# Patient Record
Sex: Female | Born: 1950 | Race: Black or African American | Hispanic: No | Marital: Married
Health system: Midwestern US, Community
[De-identification: ages and names within clinical notes are randomized; demographics above are authoritative.]

## PROBLEM LIST (undated history)

## (undated) DIAGNOSIS — F329 Major depressive disorder, single episode, unspecified: Secondary | ICD-10-CM

## (undated) DIAGNOSIS — R5383 Other fatigue: Secondary | ICD-10-CM

## (undated) DIAGNOSIS — K279 Peptic ulcer, site unspecified, unspecified as acute or chronic, without hemorrhage or perforation: Secondary | ICD-10-CM

## (undated) DIAGNOSIS — F32A Depression, unspecified: Secondary | ICD-10-CM

## (undated) DIAGNOSIS — F411 Generalized anxiety disorder: Secondary | ICD-10-CM

## (undated) DIAGNOSIS — E079 Disorder of thyroid, unspecified: Secondary | ICD-10-CM

## (undated) DIAGNOSIS — M1990 Unspecified osteoarthritis, unspecified site: Secondary | ICD-10-CM

## (undated) DIAGNOSIS — R634 Abnormal weight loss: Secondary | ICD-10-CM

## (undated) DIAGNOSIS — J189 Pneumonia, unspecified organism: Secondary | ICD-10-CM

## (undated) DIAGNOSIS — B3781 Candidal esophagitis: Secondary | ICD-10-CM

## (undated) DIAGNOSIS — Z1231 Encounter for screening mammogram for malignant neoplasm of breast: Secondary | ICD-10-CM

## (undated) DIAGNOSIS — Z1239 Encounter for other screening for malignant neoplasm of breast: Secondary | ICD-10-CM

## (undated) DIAGNOSIS — N6320 Unspecified lump in the left breast, unspecified quadrant: Secondary | ICD-10-CM

## (undated) DIAGNOSIS — Z1329 Encounter for screening for other suspected endocrine disorder: Secondary | ICD-10-CM

## (undated) DIAGNOSIS — N6324 Unspecified lump in the left breast, lower inner quadrant: Secondary | ICD-10-CM

## (undated) DIAGNOSIS — E7800 Pure hypercholesterolemia, unspecified: Principal | ICD-10-CM

## (undated) DIAGNOSIS — I10 Essential (primary) hypertension: Secondary | ICD-10-CM

## (undated) DIAGNOSIS — M8588 Other specified disorders of bone density and structure, other site: Secondary | ICD-10-CM

## (undated) DIAGNOSIS — M899 Disorder of bone, unspecified: Principal | ICD-10-CM

## (undated) DIAGNOSIS — D509 Iron deficiency anemia, unspecified: Secondary | ICD-10-CM

## (undated) DIAGNOSIS — R7303 Prediabetes: Secondary | ICD-10-CM

## (undated) DIAGNOSIS — Z0000 Encounter for general adult medical examination without abnormal findings: Secondary | ICD-10-CM

## (undated) HISTORY — DX: Generalized anxiety disorder: F41.1

## (undated) HISTORY — DX: Candidal esophagitis: B37.81

## (undated) HISTORY — DX: Abnormal weight loss: R63.4

## (undated) HISTORY — DX: Major depressive disorder, single episode, unspecified: F32.9

## (undated) HISTORY — DX: Pneumonia, unspecified organism: J18.9

## (undated) HISTORY — DX: Disorder of thyroid, unspecified: E07.9

## (undated) HISTORY — DX: Unspecified osteoarthritis, unspecified site: M19.90

## (undated) HISTORY — DX: Peptic ulcer, site unspecified, unspecified as acute or chronic, without hemorrhage or perforation: K27.9

## (undated) HISTORY — DX: Depression, unspecified: F32.A

## (undated) HISTORY — DX: Other fatigue: R53.83

---
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
## 1985-02-06 HISTORY — PX: TUBAL LIGATION: SHX77

## 2000-07-20 ENCOUNTER — Other Ambulatory Visit: Admission: RE | Admit: 2000-07-20 | Discharge: 2000-07-20 | Payer: Self-pay | Admitting: Internal Medicine

## 2000-12-19 ENCOUNTER — Ambulatory Visit (HOSPITAL_COMMUNITY): Admission: RE | Admit: 2000-12-19 | Discharge: 2000-12-19 | Payer: Self-pay | Admitting: General Surgery

## 2001-04-29 ENCOUNTER — Emergency Department (HOSPITAL_COMMUNITY): Admission: EM | Admit: 2001-04-29 | Discharge: 2001-04-29 | Payer: Self-pay | Admitting: Emergency Medicine

## 2001-04-29 ENCOUNTER — Encounter: Payer: Self-pay | Admitting: Emergency Medicine

## 2001-11-12 ENCOUNTER — Encounter: Payer: Self-pay | Admitting: Emergency Medicine

## 2001-11-12 ENCOUNTER — Emergency Department (HOSPITAL_COMMUNITY): Admission: EM | Admit: 2001-11-12 | Discharge: 2001-11-12 | Payer: Self-pay | Admitting: Emergency Medicine

## 2001-12-27 ENCOUNTER — Encounter: Payer: Self-pay | Admitting: Internal Medicine

## 2001-12-27 ENCOUNTER — Ambulatory Visit (HOSPITAL_COMMUNITY): Admission: RE | Admit: 2001-12-27 | Discharge: 2001-12-27 | Payer: Self-pay | Admitting: Internal Medicine

## 2007-02-07 HISTORY — PX: OTHER SURGICAL HISTORY: SHX169

## 2007-05-30 ENCOUNTER — Encounter: Payer: Self-pay | Admitting: Orthopedic Surgery

## 2007-06-07 ENCOUNTER — Encounter: Payer: Self-pay | Admitting: Orthopedic Surgery

## 2007-07-08 ENCOUNTER — Encounter: Payer: Self-pay | Admitting: Orthopedic Surgery

## 2008-11-05 LAB — CBC WITH AUTOMATED DIFF
ABS. BASOPHILS: 0 10*3/uL (ref 0.0–0.1)
ABS. EOSINOPHILS: 0.3 10*3/uL (ref 0.0–0.4)
ABS. LYMPHOCYTES: 2.6 10*3/uL (ref 0.8–3.5)
ABS. MONOCYTES: 0.5 10*3/uL (ref 0–1.0)
ABS. NEUTROPHILS: 2.2 10*3/uL (ref 1.8–8.0)
BASOPHILS: 0 % (ref 0–3)
EOSINOPHILS: 5 % (ref 0–5)
HCT: 35.6 % — ABNORMAL LOW (ref 36.0–46.0)
HGB: 11.8 g/dL — ABNORMAL LOW (ref 12.0–16.0)
LYMPHOCYTES: 48 % (ref 20–51)
MCH: 28.4 PG (ref 25.0–35.0)
MCHC: 33.1 g/dL (ref 31.0–37.0)
MCV: 85.8 FL (ref 78.0–102.0)
MONOCYTES: 8 % (ref 2–9)
MPV: 9.2 FL (ref 7.4–10.4)
NEUTROPHILS: 39 % — ABNORMAL LOW (ref 42–75)
PLATELET: 276 10*3/uL (ref 130–400)
RBC: 4.15 M/uL (ref 4.10–5.10)
RDW: 14.6 % — ABNORMAL HIGH (ref 11.5–14.5)
WBC: 5.5 10*3/uL (ref 4.5–13.0)

## 2008-11-05 LAB — METABOLIC PANEL, COMPREHENSIVE
A-G Ratio: 1.1 (ref 0.8–1.7)
ALT (SGPT): 29 U/L — ABNORMAL LOW (ref 30–65)
AST (SGOT): 22 U/L (ref 15–37)
Albumin: 4 g/dL (ref 3.4–5.0)
Alk. phosphatase: 123 U/L (ref 50–136)
Anion gap: 10 mmol/L (ref 5–15)
BUN/Creatinine ratio: 13 (ref 12–20)
BUN: 10 MG/DL (ref 7–18)
Bilirubin, total: 0.3 MG/DL (ref 0.1–0.9)
CO2: 29 MMOL/L (ref 21–32)
Calcium: 9 MG/DL (ref 8.4–10.4)
Chloride: 102 MMOL/L (ref 100–108)
Creatinine: 0.8 MG/DL (ref 0.6–1.3)
GFR est AA: >60 mL/min/{1.73_m2} (ref >60)
GFR est non-AA: >60 mL/min/{1.73_m2} (ref >60)
Globulin: 3.8 g/dL (ref 2.0–4.0)
Glucose: 93 MG/DL (ref 74–99)
Potassium: 5.1 MMOL/L (ref 3.5–5.5)
Protein, total: 7.8 g/dL (ref 6.4–8.2)
Sodium: 141 MMOL/L (ref 136–145)

## 2008-11-05 LAB — LIPID PANEL
CHOL/HDL Ratio: 4.8 (ref 0–5.0)
Cholesterol, total: 268 MG/DL — ABNORMAL HIGH (ref 0–200)
HDL Cholesterol: 56 MG/DL (ref 40–60)
LDL, calculated: 193.2 MG/DL — ABNORMAL HIGH (ref 0–100)
LDL/HDL Ratio: 3.5
Triglyceride: 94 MG/DL (ref 0–150)
VLDL, calculated: 18.8 MG/DL

## 2008-11-05 LAB — TSH 3RD GENERATION: TSH: 2.46 u[IU]/mL (ref 0.51–6.27)

## 2008-11-06 LAB — VITAMIN D, 25 HYDROXY: Vitamin D 25-Hydroxy: 19 ng/mL — ABNORMAL LOW (ref 30–80)

## 2009-02-06 HISTORY — PX: COLONOSCOPY: SHX174

## 2009-02-06 HISTORY — PX: ESOPHAGOGASTRODUODENOSCOPY ENDOSCOPY: SHX5814

## 2009-03-28 ENCOUNTER — Ambulatory Visit: Payer: Self-pay | Admitting: Cardiology

## 2009-03-28 ENCOUNTER — Observation Stay (HOSPITAL_COMMUNITY): Admission: EM | Admit: 2009-03-28 | Discharge: 2009-03-30 | Payer: Self-pay | Admitting: Emergency Medicine

## 2009-03-29 ENCOUNTER — Encounter (INDEPENDENT_AMBULATORY_CARE_PROVIDER_SITE_OTHER): Payer: Self-pay | Admitting: Internal Medicine

## 2009-04-02 ENCOUNTER — Ambulatory Visit: Payer: Self-pay | Admitting: Internal Medicine

## 2009-04-02 DIAGNOSIS — E7800 Pure hypercholesterolemia, unspecified: Secondary | ICD-10-CM

## 2009-04-02 DIAGNOSIS — K219 Gastro-esophageal reflux disease without esophagitis: Secondary | ICD-10-CM

## 2009-04-02 DIAGNOSIS — R519 Headache, unspecified: Secondary | ICD-10-CM | POA: Insufficient documentation

## 2009-04-02 DIAGNOSIS — K589 Irritable bowel syndrome without diarrhea: Secondary | ICD-10-CM

## 2009-04-02 DIAGNOSIS — Z860100 Personal history of colon polyps, unspecified: Secondary | ICD-10-CM | POA: Insufficient documentation

## 2009-04-02 DIAGNOSIS — R634 Abnormal weight loss: Secondary | ICD-10-CM

## 2009-04-02 DIAGNOSIS — R51 Headache: Secondary | ICD-10-CM

## 2009-04-02 DIAGNOSIS — E039 Hypothyroidism, unspecified: Secondary | ICD-10-CM | POA: Insufficient documentation

## 2009-04-02 DIAGNOSIS — D649 Anemia, unspecified: Secondary | ICD-10-CM

## 2009-04-09 ENCOUNTER — Encounter: Payer: Self-pay | Admitting: Internal Medicine

## 2009-04-27 ENCOUNTER — Ambulatory Visit (HOSPITAL_COMMUNITY): Admission: RE | Admit: 2009-04-27 | Discharge: 2009-04-27 | Payer: Self-pay | Admitting: Internal Medicine

## 2009-04-27 ENCOUNTER — Ambulatory Visit: Payer: Self-pay | Admitting: Internal Medicine

## 2009-04-29 ENCOUNTER — Encounter: Payer: Self-pay | Admitting: Internal Medicine
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
## 2010-03-08 NOTE — Letter (Signed)
Summary: Patient Notice, Colon Biopsy Results  [facility]  [address]   [place], [place] [address]   Phone: [phone]  Fax: [phone]       April 29, 2009   Betty Brown [address] [facility] [address] [place], [place]  [address] [birth year]    Dear [name],  I am pleased to inform you that the biopsies taken during your recent colonoscopy did not show any evidence of cancer upon pathologic examination.  The biopsies of your stomach revealed only mild inflammation.  Additional information/recommendations:  You should have a repeat colonoscopy examination  in 3 years.  Please call us if you are having persistent problems or have questions about your condition that have not been fully answered at this time.  Sincerely,    R. Roetta Sessions MD  [facility] Ph: [phone]    Fax: [phone]   Appended Document: Patient Notice, Colon Biopsy Results Letter mailed to pt.  Appended Document: Patient Notice, Colon Biopsy Results LMOM pp fu appt in 6 weeks appt for 06/15/09 @ 4:15pm w/RMR

## 2010-03-08 NOTE — Letter (Signed)
Summary: TCS/EGD ORDER  TCS/EGD ORDER   Imported By: Ave Filter 04/02/2009 12:31:37  _____________________________________________________________________  External Attachment:    Type:   Image     Comment:   External Document

## 2010-03-08 NOTE — Letter (Signed)
Summary: EGD/TCS/PATH/2002/DR SMITH  EGD/TCS/PATH/2002/DR SMITH   Imported By: Diana Eves 04/09/2009 09:19:44  _____________________________________________________________________  External Attachment:    Type:   Image     Comment:   External Document

## 2010-04-27 LAB — CBC
HCT: 33.8 % — ABNORMAL LOW (ref 36.0–46.0)
HCT: 38.4 % (ref 36.0–46.0)
Hemoglobin: 11.5 g/dL — ABNORMAL LOW (ref 12.0–15.0)
Hemoglobin: 11.6 g/dL — ABNORMAL LOW (ref 12.0–15.0)
Hemoglobin: 13.1 g/dL (ref 12.0–15.0)
MCHC: 33.7 g/dL (ref 30.0–36.0)
MCHC: 34 g/dL (ref 30.0–36.0)
MCHC: 34.2 g/dL (ref 30.0–36.0)
MCV: 96.3 fL (ref 78.0–100.0)
RBC: 3.5 MIL/uL — ABNORMAL LOW (ref 3.87–5.11)
RBC: 3.53 MIL/uL — ABNORMAL LOW (ref 3.87–5.11)
RBC: 4.01 MIL/uL (ref 3.87–5.11)
RDW: 13.7 % (ref 11.5–15.5)
RDW: 13.7 % (ref 11.5–15.5)
WBC: 7.6 10*3/uL (ref 4.0–10.5)

## 2010-04-27 LAB — BASIC METABOLIC PANEL
CO2: 24 mEq/L (ref 19–32)
CO2: 26 mEq/L (ref 19–32)
Calcium: 9 mg/dL (ref 8.4–10.5)
Calcium: 9.8 mg/dL (ref 8.4–10.5)
Chloride: 103 mEq/L (ref 96–112)
Creatinine, Ser: 0.71 mg/dL (ref 0.4–1.2)
GFR calc Af Amer: >60 mL/min (ref >60)
GFR calc Af Amer: >60 mL/min (ref >60)
GFR calc Af Amer: >60 mL/min (ref >60)
GFR calc non Af Amer: >60 mL/min (ref >60)
Glucose, Bld: 77 mg/dL (ref 70–99)
Glucose, Bld: 77 mg/dL (ref 70–99)
Potassium: 4 mEq/L (ref 3.5–5.1)
Potassium: 4 mEq/L (ref 3.5–5.1)
Sodium: 133 mEq/L — ABNORMAL LOW (ref 135–145)
Sodium: 141 mEq/L (ref 135–145)

## 2010-04-27 LAB — PROTIME-INR
INR: 1.02 (ref 0.00–1.49)
INR: 1.17 (ref 0.00–1.49)

## 2010-04-27 LAB — APTT
aPTT: 38 seconds — ABNORMAL HIGH (ref 24–37)
aPTT: 41 seconds — ABNORMAL HIGH (ref 24–37)

## 2010-04-27 LAB — DIFFERENTIAL
Basophils Absolute: 0.1 10*3/uL (ref 0.0–0.1)
Basophils Relative: 1 % (ref 0–1)
Basophils Relative: 1 % (ref 0–1)
Basophils Relative: 1 % (ref 0–1)
Eosinophils Relative: 4 % (ref 0–5)
Lymphocytes Relative: 28 % (ref 12–46)
Lymphs Abs: 2.1 10*3/uL (ref 0.7–4.0)
Monocytes Absolute: 0.4 10*3/uL (ref 0.1–1.0)
Monocytes Absolute: 0.5 10*3/uL (ref 0.1–1.0)
Monocytes Absolute: 0.5 10*3/uL (ref 0.1–1.0)
Monocytes Relative: 5 % (ref 3–12)
Monocytes Relative: 5 % (ref 3–12)
Monocytes Relative: 7 % (ref 3–12)
Neutro Abs: 4.1 10*3/uL (ref 1.7–7.7)
Neutro Abs: 4.7 10*3/uL (ref 1.7–7.7)
Neutro Abs: 7.3 10*3/uL (ref 1.7–7.7)
Neutrophils Relative %: 53 % (ref 43–77)

## 2010-04-27 LAB — CK TOTAL AND CKMB (NOT AT ARMC)
CK, MB: 1.3 ng/mL (ref 0.3–4.0)
Total CK: 34 U/L (ref 7–177)

## 2010-04-27 LAB — CARDIAC PANEL(CRET KIN+CKTOT+MB+TROPI): Total CK: 45 U/L (ref 7–177)

## 2010-06-24 NOTE — H&P (Signed)
[facility]  Patient:    Betty Brown, Betty Brown Visit Number: [id number] MRN: [id number]          Service Type: DSU Location: DAY Attending Physician:  Dessa Phi Dictated by:   Elpidio Anis, M.D. Admit Date:  12/19/2000 Discharge Date: 12/19/2000                           History and Physical  HISTORY OF PRESENT ILLNESS:  A 60 year old female with a history of gastroesophageal reflux disease, recurrent severe heartburn, chronic constipation with alternating diarrhea, and guaiac-positive stools.  There is no family history of intestinal disease.  No history of colon cancer or polyps Patient is scheduled for upper and lower endoscopy.  PAST MEDICAL HISTORY:  Status post subtotal thyroidectomy for toxic nodular goiter.  Other medical illnesses:  Hyperlipidemia, depression.  MEDICATIONS: 1. Nexium 40 mg q.d. 2. Prozac 20 mg q.h.s. 3. Activella q.h.s.  PHYSICAL EXAMINATION:  VITAL SIGNS:  Blood pressure 110/80, pulse 104, respirations 16, weight 154 pounds.  HEENT:  Normal.  NECK:  Supple without JVD or bruit.  CHEST:  Clear to auscultation.  HEART:  Regular rate and rhythm without murmur, gallop, or rub.  ABDOMEN:  Soft, nontender, no masses.  RECTAL:  Normal.  Stool guaiac positive.  EXTREMITIES:  No cyanosis, clubbing, or edema.  NEUROLOGIC:  No focal deficit.  IMPRESSION: 1. Chronic severe dyspepsia. 2. Gastroesophageal reflux disease. 3. Recurrent constipation with guaiac-positive stool. 4. Chronic depression.  PLAN:  Esophagogastroduodenoscopy and colonoscopy. Dictated by:   Elpidio Anis, M.D. Attending Physician:  Dessa Phi DD:  12/18/00 TD:  12/18/00 Job: [id number]

## 2010-08-11 ENCOUNTER — Other Ambulatory Visit (HOSPITAL_COMMUNITY): Payer: Self-pay | Admitting: Family Medicine

## 2010-08-11 DIAGNOSIS — R51 Headache: Secondary | ICD-10-CM

## 2010-08-15 ENCOUNTER — Ambulatory Visit (HOSPITAL_COMMUNITY)
Admission: RE | Admit: 2010-08-15 | Discharge: 2010-08-15 | Disposition: A | Discharge status: Home / Self Care | Payer: BC Managed Care – PPO | Source: Ambulatory Visit | Attending: Family Medicine | Admitting: Family Medicine

## 2010-08-15 DIAGNOSIS — R51 Headache: Secondary | ICD-10-CM | POA: Insufficient documentation

## 2010-09-11 NOTE — ED Notes (Signed)
Patient called to MTA.  No response.

## 2010-09-11 NOTE — ED Notes (Signed)
Pt. C/o dizziness, nausea, denies  Vomiting,chest pain or shortness of breath

## 2011-08-07 ENCOUNTER — Encounter

## 2012-04-15 ENCOUNTER — Encounter: Payer: Self-pay | Admitting: Internal Medicine

## 2014-02-24 ENCOUNTER — Ambulatory Visit (HOSPITAL_COMMUNITY): Payer: Self-pay | Admitting: Psychiatry

## 2014-05-05 ENCOUNTER — Encounter: Payer: Self-pay | Admitting: Internal Medicine

## 2014-05-21 ENCOUNTER — Encounter: Payer: Self-pay | Admitting: Nurse Practitioner

## 2014-05-21 ENCOUNTER — Other Ambulatory Visit: Payer: Self-pay

## 2014-05-21 ENCOUNTER — Ambulatory Visit (INDEPENDENT_AMBULATORY_CARE_PROVIDER_SITE_OTHER): Payer: BC Managed Care – PPO | Admitting: Nurse Practitioner

## 2014-05-21 VITALS — BP 112/73 | HR 81 | Temp 97.1°F | Ht 66.0 in | Wt 96.6 lb

## 2014-05-21 DIAGNOSIS — R194 Change in bowel habit: Secondary | ICD-10-CM | POA: Diagnosis not present

## 2014-05-21 DIAGNOSIS — R634 Abnormal weight loss: Secondary | ICD-10-CM | POA: Diagnosis not present

## 2014-05-21 DIAGNOSIS — R1314 Dysphagia, pharyngoesophageal phase: Secondary | ICD-10-CM

## 2014-05-21 LAB — CBC WITH DIFFERENTIAL/PLATELET
Basophils Absolute: 0.1 10*3/uL (ref 0.0–0.1)
Basophils Relative: 1 % (ref 0–1)
Eosinophils Absolute: 0.2 10*3/uL (ref 0.0–0.7)
Eosinophils Relative: 2 % (ref 0–5)
HEMATOCRIT: 37.3 % (ref 36.0–46.0)
HEMOGLOBIN: 12.5 g/dL (ref 12.0–15.0)
LYMPHS ABS: 1.2 10*3/uL (ref 0.7–4.0)
LYMPHS PCT: 15 % (ref 12–46)
MCH: 31.7 pg (ref 26.0–34.0)
MCHC: 33.5 g/dL (ref 30.0–36.0)
MCV: 94.7 fL (ref 78.0–100.0)
MONO ABS: 0.5 10*3/uL (ref 0.1–1.0)
MONOS PCT: 7 % (ref 3–12)
MPV: 9.6 fL (ref 8.6–12.4)
NEUTROS ABS: 5.9 10*3/uL (ref 1.7–7.7)
NEUTROS PCT: 75 % (ref 43–77)
Platelets: 307 10*3/uL (ref 150–400)
RBC: 3.94 MIL/uL (ref 3.87–5.11)
RDW: 14.7 % (ref 11.5–15.5)
WBC: 7.8 10*3/uL (ref 4.0–10.5)

## 2014-05-21 LAB — COMPREHENSIVE METABOLIC PANEL
ALK PHOS: 61 U/L (ref 39–117)
ALT: 11 U/L (ref 0–35)
AST: 20 U/L (ref 0–37)
Albumin: 4.3 g/dL (ref 3.5–5.2)
BILIRUBIN TOTAL: 0.2 mg/dL (ref 0.2–1.2)
BUN: 8 mg/dL (ref 6–23)
CHLORIDE: 102 meq/L (ref 96–112)
CO2: 23 mEq/L (ref 19–32)
Calcium: 9.4 mg/dL (ref 8.4–10.5)
Creat: 0.69 mg/dL (ref 0.50–1.10)
GLUCOSE: 72 mg/dL (ref 70–99)
POTASSIUM: 5 meq/L (ref 3.5–5.3)
Sodium: 139 mEq/L (ref 135–145)
TOTAL PROTEIN: 6.9 g/dL (ref 6.0–8.3)

## 2014-05-21 MED ORDER — PEG 3350-KCL-NA BICARB-NACL 420 G PO SOLR: 4000.0000 mL | ORAL | Status: DC

## 2014-05-21 NOTE — Progress Notes (Signed)
Primary Care Physician:  Altamease OilerHARRIS,MEREDITH L, FNP Primary Gastroenterologist:  Dr. Jena Gaussourk  Chief Complaint  Patient presents with  . Weight Loss  . Gastrophageal Reflux  . set up procedures    HPI:   64 year old female presents her referral from PCP for consideration of colonoscopy/endoscopy. Reviewed PCP notes and per their note patient has had unintentional weight loss of greater than 20 pounds in the past year. Has been under a lot of stress and anxiety, but as this improved and the patient's by mouth intake improved she continued to lose weight and have fatigue. Last colonoscopy on file in our system is 04/29/2009 which found a 1 cm cecal polyp which was tubular adenoma on pathology. Endoscopy to same time with the stomach biopsy and small intestine biopsy showed mild chronic gastritis with no evidence of H. Pylori; benign small bowel mucosa no villous atrophy, inflammation, or other abnormalities present.  Today she states she's lost an additional 3 pounds since seeing her PCP. She's trying to put on weight but keeps losing. Also fatigue which has been stable over the past 2 months but still noticeable and requires frequent rests and a nap during the day. Her symptoms began about 8 months ago. Her stress level is much improved. Typically if she's stressed she "can't eat" however she is eating well. Admits abdominal pain in her lower abdomen, but admits she has a history of IBS and GERD. Admits some nausea, denies vomiting. Denies fever, chills. Denies hematochezia or melena. States her PCP did a stool/heme test, but no labs were sent and patient states she was not informed of the result. Takes BC powders daily. Also takes NSAIDs daily as well. Also admits some dysphagia in the past month or so. Has a bowel movement about every 1-2 days and is described as variable in consistency but passes easily. Has had a change in her bowel habits with increased frequency and increased proportion of diarrhea  stools. Denies thin stools (pencil or ribbon thin.) Denies any other upper lower GI problems.  Past Medical History  Diagnosis Date  . Generalized anxiety disorder   . Fatigue   . Weight loss   . Depression   . Arthritis   . PUD (peptic ulcer disease)   . Pneumonia   . Thyroid disorder     Past Surgical History  Procedure Laterality Date  . Thyroid surgery  1962    goiter removal  . Tubal ligation  1987  . Arm fracture repair  2009  . Colonoscopy  2011  . Esophagogastroduodenoscopy endoscopy  2011    Current Outpatient Prescriptions  Medication Sig Dispense Refill  . Aspirin-Salicylamide-Caffeine (BC HEADACHE POWDER PO) Take by mouth daily.    . fluticasone-salmeterol (ADVAIR HFA) 115-21 MCG/ACT inhaler Inhale 2 puffs into the lungs 2 (two) times daily.    Marland Kitchen. HYDROcodone-acetaminophen (NORCO) 7.5-325 MG per tablet     . LORazepam (ATIVAN) 0.5 MG tablet     . omeprazole (PRILOSEC) 20 MG capsule     . sertraline (ZOLOFT) 50 MG tablet      No current facility-administered medications for this visit.    Allergies as of 05/21/2014 - Review Complete 05/21/2014  Allergen Reaction Noted  . Atorvastatin      Family History  Problem Relation Age of Onset  . Colon cancer Neg Hx     History   Social History  . Marital Status: Single    Spouse Name: N/A  . Number of Children: N/A  .  Years of Education: N/A   Occupational History  . Not on file.   Social History Main Topics  . Smoking status: Current Every Day Smoker -- 1.00 packs/day    Types: Cigarettes  . Smokeless tobacco: Never Used  . Alcohol Use: No  . Drug Use: No  . Sexual Activity: Not on file   Other Topics Concern  . Not on file   Social History Narrative    Review of Systems: General: Positive for anorexia, weight loss, fatigue, weakness. Denies fever, chills. Eyes: Negative for vision changes.  ENT: Negative for nasal congestion. CV: Negative for chest pain, angina, palpitations, dyspnea on  exertion, peripheral edema.  Respiratory: Negative for dyspnea on exertion, cough, sputum, wheezing worse than baseline.  GI: See history of present illness. MS: Negative for joint pain, low back pain.  Derm: Negative for rash or itching.  Neuro: Negative for weakness, seizure, memory loss, confusion.  Psych: Negative for anxiety, depression.  Endo: Positive for unusual weight change.  Heme: Negative for bruising or bleeding. Allergy: Negative for rash or hives.   Physical Exam: BP 112/73 mmHg  Pulse 81  Temp(Src) 97.1 F (36.2 C)  Ht  (1.676 m)  Wt 96 lb 9.6 oz (43.817 kg)  BMI 15.60 kg/m2 General:   Alert and oriented. Pleasant and cooperative. Well-nourished and well-developed.  Head:  Normocephalic and atraumatic. Eyes:  Without icterus, sclera clear and conjunctiva pink.  Ears:  Normal auditory acuity. Mouth:  No deformity or lesions, oral mucosa pink. No OP edema. Neck:  Supple, without mass or thyromegaly. Lungs:  Clear to auscultation bilaterally. No wheezes, rales, or rhonchi. No distress.  Heart:  S1, S2 present without murmurs appreciated.  Abdomen:  +BS, soft, and non-distended. Mild TTP lower abdomen and epigastric area. No HSM noted. No guarding or rebound. No masses appreciated.  Rectal:  Deferred  Msk:  Symmetrical without gross deformities. Normal posture. Pulses:  Normal DP pulses noted. Extremities:  Without clubbing or edema. Neurologic:  Alert and  oriented x4;  grossly normal neurologically. Skin:  Intact without significant lesions or rashes. Cervical Nodes:  No significant cervical adenopathy. Psych:  Alert and cooperative. Normal mood and affect.     05/21/2014 9:17 AM

## 2014-05-21 NOTE — Assessment & Plan Note (Addendum)
64 year old female presents for multiple complaints including change in bowel habits with increased frequency and increased portion of diarrhea stools. This examination with other symptoms, including unintentional weight loss despite a good appetite, worsening fatigue, nausea, and abdominal pain are concerning. Denies hematochezia and melena, fever, and chills. Her PCP did a heme test for stool but results are unknown at this point. Last colonoscopy was completed and 2011 which found a 1 cm cecal polyp found is tubular adenoma on pathology. Her symptoms have been occurring and progressing for the past 8 months. Today I'll check a CBC and tissue transglutaminase IgA to evaluate for possible celiac. Given her symptom presentation we'll also proceed with a diagnostic colonoscopy at this time.  Proceed with TCS with Dr. Jena Gaussourk in near future: the risks, benefits, and alternatives have been discussed with the patient in detail. The patient states understanding and desires to proceed.   Patient is on hydrocodone, Ativan, and Zoloft. Is not on any anticoagulants or diabetic medications. Denies alcohol use and drug use. Given her polypharmacy we will plan to do her procedure in the OR with conscious sedation.

## 2014-05-21 NOTE — Assessment & Plan Note (Addendum)
Patient with worsening dysphagia over the past month. Admits to taking BC powders daily as well as NSAIDs daily. Has a history of peptic ulcer disease. Will be receiving a diagnostic colonoscopy this time for other symptoms (see bowel habit changes and unintentional weight loss assessment and plan notes). This point we'll had an endoscopy to evaluate her dysphagia. Possible etiologies include esophagitis, age-related dysmotility, stricture, web, or ring.  Proceed with EGD +/- dilation with Dr. Jena Gaussourk in near future: the risks, benefits, and alternatives have been discussed with the patient in detail. The patient states understanding and desires to proceed.   Patient is on hydrocodone, Ativan, and Zoloft. Is not on any anticoagulants or diabetic medications. Denies alcohol use and drug use. Given her polypharmacy we will plan to do her procedure in the OR with conscious sedation.

## 2014-05-21 NOTE — Assessment & Plan Note (Addendum)
64 year old female presents for multiple complaints, including unintentional weight loss of approximately 20 pounds over the past year per her PCP note despite good appetite. Also states she is also additional 3 pounds since last seen her PCP 2-3 weeks ago. He is also had some bowel habit changes including increasing frequency and proportion of stools that are diarrhea, as well as worsening fatigue, and abdominal pain. Her symptoms have progressed over the past 8 months in severity. PCP did a heme stool test, which the results are not known at this time. Last colonoscopy in 2011 which found a 1 cm cecal polyp which was tubular adenoma on pathology. Given her current presentation with these concerning symptoms we'll plan for repeat colonoscopy to further evaluate.  Proceed with TCS with Dr. Jena Gaussourk in near future: the risks, benefits, and alternatives have been discussed with the patient in detail. The patient states understanding and desires to proceed.   Patient is on hydrocodone, Ativan, and Zoloft. Is not on any anticoagulants or diabetic medications. Denies alcohol use and drug use. Given her polypharmacy we will plan to do her procedure in the OR with conscious sedation.

## 2014-05-21 NOTE — Patient Instructions (Signed)
1. We will schedule your procedure for you (colonoscopy and endoscopy) 2. Return for follow-up in 3 months or sooner based on the results of your procedure.

## 2014-05-22 ENCOUNTER — Other Ambulatory Visit: Payer: Self-pay

## 2014-05-22 LAB — TISSUE TRANSGLUTAMINASE, IGA: Tissue Transglutaminase Ab, IgA: 1 U/mL (ref <4)

## 2014-05-25 NOTE — Progress Notes (Signed)
cc'ed to pcp °

## 2014-06-01 ENCOUNTER — Encounter (HOSPITAL_COMMUNITY): Payer: Self-pay

## 2014-06-01 ENCOUNTER — Encounter (HOSPITAL_COMMUNITY)
Admission: RE | Admit: 2014-06-01 | Discharge: 2014-06-01 | Disposition: A | Discharge status: Home / Self Care | Payer: BC Managed Care – PPO | Source: Ambulatory Visit | Attending: Internal Medicine | Admitting: Internal Medicine

## 2014-06-04 ENCOUNTER — Ambulatory Visit (HOSPITAL_COMMUNITY)
Admission: RE | Admit: 2014-06-04 | Discharge: 2014-06-04 | Disposition: A | Discharge status: Home / Self Care | Payer: BC Managed Care – PPO | Source: Ambulatory Visit | Attending: Internal Medicine | Admitting: Internal Medicine

## 2014-06-04 ENCOUNTER — Ambulatory Visit (HOSPITAL_COMMUNITY): Payer: BC Managed Care – PPO | Admitting: Anesthesiology

## 2014-06-04 ENCOUNTER — Encounter (HOSPITAL_COMMUNITY)
Admission: RE | Disposition: A | Discharge status: Home / Self Care | Payer: Self-pay | Source: Ambulatory Visit | Attending: Internal Medicine

## 2014-06-04 ENCOUNTER — Ambulatory Visit (HOSPITAL_COMMUNITY)
Admission: RE | Admit: 2014-06-04 | Payer: BC Managed Care – PPO | Source: Ambulatory Visit | Admitting: Internal Medicine

## 2014-06-04 ENCOUNTER — Encounter (HOSPITAL_COMMUNITY): Payer: Self-pay

## 2014-06-04 ENCOUNTER — Other Ambulatory Visit: Payer: Self-pay

## 2014-06-04 ENCOUNTER — Encounter (HOSPITAL_COMMUNITY): Admission: RE | Payer: Self-pay | Source: Ambulatory Visit

## 2014-06-04 DIAGNOSIS — Z7951 Long term (current) use of inhaled steroids: Secondary | ICD-10-CM | POA: Diagnosis not present

## 2014-06-04 DIAGNOSIS — F411 Generalized anxiety disorder: Secondary | ICD-10-CM | POA: Diagnosis not present

## 2014-06-04 DIAGNOSIS — R630 Anorexia: Secondary | ICD-10-CM | POA: Diagnosis present

## 2014-06-04 DIAGNOSIS — F329 Major depressive disorder, single episode, unspecified: Secondary | ICD-10-CM | POA: Diagnosis not present

## 2014-06-04 DIAGNOSIS — K229 Disease of esophagus, unspecified: Secondary | ICD-10-CM | POA: Diagnosis not present

## 2014-06-04 DIAGNOSIS — K219 Gastro-esophageal reflux disease without esophagitis: Secondary | ICD-10-CM | POA: Diagnosis not present

## 2014-06-04 DIAGNOSIS — E890 Postprocedural hypothyroidism: Secondary | ICD-10-CM | POA: Insufficient documentation

## 2014-06-04 DIAGNOSIS — R1314 Dysphagia, pharyngoesophageal phase: Secondary | ICD-10-CM | POA: Diagnosis not present

## 2014-06-04 DIAGNOSIS — K589 Irritable bowel syndrome without diarrhea: Secondary | ICD-10-CM | POA: Diagnosis not present

## 2014-06-04 DIAGNOSIS — M1990 Unspecified osteoarthritis, unspecified site: Secondary | ICD-10-CM | POA: Insufficient documentation

## 2014-06-04 DIAGNOSIS — K259 Gastric ulcer, unspecified as acute or chronic, without hemorrhage or perforation: Secondary | ICD-10-CM | POA: Diagnosis not present

## 2014-06-04 DIAGNOSIS — J449 Chronic obstructive pulmonary disease, unspecified: Secondary | ICD-10-CM | POA: Insufficient documentation

## 2014-06-04 DIAGNOSIS — K209 Esophagitis, unspecified: Secondary | ICD-10-CM | POA: Insufficient documentation

## 2014-06-04 DIAGNOSIS — Z7982 Long term (current) use of aspirin: Secondary | ICD-10-CM | POA: Insufficient documentation

## 2014-06-04 DIAGNOSIS — K6289 Other specified diseases of anus and rectum: Secondary | ICD-10-CM | POA: Insufficient documentation

## 2014-06-04 DIAGNOSIS — K5730 Diverticulosis of large intestine without perforation or abscess without bleeding: Secondary | ICD-10-CM | POA: Diagnosis not present

## 2014-06-04 DIAGNOSIS — Z791 Long term (current) use of non-steroidal anti-inflammatories (NSAID): Secondary | ICD-10-CM | POA: Diagnosis not present

## 2014-06-04 DIAGNOSIS — K449 Diaphragmatic hernia without obstruction or gangrene: Secondary | ICD-10-CM | POA: Insufficient documentation

## 2014-06-04 DIAGNOSIS — R194 Change in bowel habit: Secondary | ICD-10-CM

## 2014-06-04 DIAGNOSIS — Z86010 Personal history of colonic polyps: Secondary | ICD-10-CM

## 2014-06-04 DIAGNOSIS — Z79891 Long term (current) use of opiate analgesic: Secondary | ICD-10-CM | POA: Insufficient documentation

## 2014-06-04 DIAGNOSIS — K3189 Other diseases of stomach and duodenum: Secondary | ICD-10-CM

## 2014-06-04 DIAGNOSIS — K2289 Other specified disease of esophagus: Secondary | ICD-10-CM | POA: Insufficient documentation

## 2014-06-04 DIAGNOSIS — F17210 Nicotine dependence, cigarettes, uncomplicated: Secondary | ICD-10-CM | POA: Insufficient documentation

## 2014-06-04 DIAGNOSIS — Z09 Encounter for follow-up examination after completed treatment for conditions other than malignant neoplasm: Secondary | ICD-10-CM | POA: Diagnosis present

## 2014-06-04 DIAGNOSIS — R634 Abnormal weight loss: Secondary | ICD-10-CM

## 2014-06-04 DIAGNOSIS — K648 Other hemorrhoids: Secondary | ICD-10-CM | POA: Insufficient documentation

## 2014-06-04 DIAGNOSIS — K2950 Unspecified chronic gastritis without bleeding: Secondary | ICD-10-CM | POA: Insufficient documentation

## 2014-06-04 DIAGNOSIS — R1310 Dysphagia, unspecified: Secondary | ICD-10-CM | POA: Diagnosis present

## 2014-06-04 HISTORY — PX: ESOPHAGEAL DILATION: SHX303

## 2014-06-04 HISTORY — PX: ESOPHAGOGASTRODUODENOSCOPY (EGD) WITH PROPOFOL: SHX5813

## 2014-06-04 HISTORY — PX: COLONOSCOPY WITH PROPOFOL: SHX5780

## 2014-06-04 HISTORY — PX: BIOPSY: SHX5522

## 2014-06-04 LAB — KOH PREP

## 2014-06-04 SURGERY — COLONOSCOPY
Anesthesia: Moderate Sedation

## 2014-06-04 SURGERY — ESOPHAGOGASTRODUODENOSCOPY (EGD) WITH PROPOFOL
Anesthesia: Monitor Anesthesia Care

## 2014-06-04 MED ORDER — PROPOFOL 10 MG/ML IV BOLUS
INTRAVENOUS | Status: AC
  Filled 2014-06-04: qty 20

## 2014-06-04 MED ORDER — FENTANYL CITRATE (PF) 100 MCG/2ML IJ SOLN
INTRAMUSCULAR | Status: AC
  Filled 2014-06-04: qty 2

## 2014-06-04 MED ORDER — MIDAZOLAM HCL 2 MG/2ML IJ SOLN
1.0000 mg | INTRAMUSCULAR | Status: DC | PRN
  Administered 2014-06-04 (×2): 2 mg via INTRAVENOUS
  Filled 2014-06-04: qty 2

## 2014-06-04 MED ORDER — LIDOCAINE HCL (PF) 1 % IJ SOLN
INTRAMUSCULAR | Status: AC
  Filled 2014-06-04: qty 5

## 2014-06-04 MED ORDER — PROPOFOL INFUSION 10 MG/ML OPTIME
INTRAVENOUS | Status: DC | PRN
  Administered 2014-06-04: 100 ug/kg/min via INTRAVENOUS
  Administered 2014-06-04: 150 ug/kg/min via INTRAVENOUS

## 2014-06-04 MED ORDER — LIDOCAINE HCL (CARDIAC) 10 MG/ML IV SOLN
INTRAVENOUS | Status: DC | PRN
  Administered 2014-06-04: 50 mg via INTRAVENOUS

## 2014-06-04 MED ORDER — PROPOFOL 10 MG/ML IV BOLUS
INTRAVENOUS | Status: DC | PRN
  Administered 2014-06-04 (×3): 10 mg via INTRAVENOUS

## 2014-06-04 MED ORDER — LIDOCAINE VISCOUS 2 % MT SOLN
15.0000 mL | Freq: Once | OROMUCOSAL | Status: AC
  Administered 2014-06-04: 15 mL via OROMUCOSAL

## 2014-06-04 MED ORDER — LACTATED RINGERS IV SOLN
INTRAVENOUS | Status: DC
  Administered 2014-06-04: 11:00:00 via INTRAVENOUS

## 2014-06-04 MED ORDER — WATER FOR IRRIGATION, STERILE IR SOLN
Status: DC | PRN
  Administered 2014-06-04: 1000 mL

## 2014-06-04 MED ORDER — FENTANYL CITRATE (PF) 100 MCG/2ML IJ SOLN: 25.0000 ug | INTRAMUSCULAR | Status: DC | PRN

## 2014-06-04 MED ORDER — ONDANSETRON HCL 4 MG/2ML IJ SOLN: 4.0000 mg | Freq: Once | INTRAMUSCULAR | Status: DC | PRN

## 2014-06-04 MED ORDER — MIDAZOLAM HCL 2 MG/2ML IJ SOLN
INTRAMUSCULAR | Status: AC
  Filled 2014-06-04: qty 2

## 2014-06-04 MED ORDER — MEPERIDINE HCL 50 MG/ML IJ SOLN: 6.2500 mg | Freq: Once | INTRAMUSCULAR | Status: DC

## 2014-06-04 MED ORDER — ONDANSETRON HCL 4 MG/2ML IJ SOLN
4.0000 mg | Freq: Once | INTRAMUSCULAR | Status: AC
  Administered 2014-06-04: 4 mg via INTRAVENOUS
  Filled 2014-06-04: qty 2

## 2014-06-04 MED ORDER — FENTANYL CITRATE (PF) 100 MCG/2ML IJ SOLN
INTRAMUSCULAR | Status: DC | PRN
Start: 2014-06-04 — End: 2014-06-04
  Administered 2014-06-04 (×4): 25 ug via INTRAVENOUS

## 2014-06-04 MED ORDER — MIDAZOLAM HCL 5 MG/5ML IJ SOLN
INTRAMUSCULAR | Status: DC | PRN
  Administered 2014-06-04 (×2): 1 mg via INTRAVENOUS

## 2014-06-04 MED ORDER — SODIUM CHLORIDE 0.9 % IV SOLN: INTRAVENOUS | Status: DC

## 2014-06-04 MED ORDER — LIDOCAINE VISCOUS 2 % MT SOLN
OROMUCOSAL | Status: AC
  Filled 2014-06-04: qty 15

## 2014-06-04 MED ORDER — FENTANYL CITRATE (PF) 100 MCG/2ML IJ SOLN
25.0000 ug | INTRAMUSCULAR | Status: AC
  Administered 2014-06-04 (×2): 25 ug via INTRAVENOUS
  Filled 2014-06-04: qty 2

## 2014-06-04 MED ORDER — LIDOCAINE VISCOUS 2 % MT SOLN: 15.0000 mL | Freq: Once | OROMUCOSAL | Status: DC

## 2014-06-04 MED ORDER — STERILE WATER FOR IRRIGATION IR SOLN
Status: DC | PRN
  Administered 2014-06-04: 1000 mL

## 2014-06-04 SURGICAL SUPPLY — 37 items
BALLN CRE LF 10-12 240X5.5 (BALLOONS)
BALLN CRE LF 10-12MM 240X5.5 (BALLOONS)
BALLN DILATOR CRE 12-15 240 (BALLOONS)
BALLN DILATOR CRE 15-18 240 (BALLOONS) IMPLANT
BALLN DILATOR CRE 18-20 240 (BALLOONS) IMPLANT
BALLN DILATOR CRE WIREGUIDE (BALLOONS)
BALLOON CRE LF 10-12 240X5.5 (BALLOONS) IMPLANT
BALLOON DILATOR CRE 12-15 240 (BALLOONS) IMPLANT
BALLOON DILATOR CRE WIREGUIDE (BALLOONS) IMPLANT
BLOCK BITE 60FR ADLT L/F BLUE (MISCELLANEOUS) ×3 IMPLANT
BRUSH CYTO GASTROINTESTINAL (MISCELLANEOUS) ×3 IMPLANT
DEVICE CLIP HEMOSTAT 235CM (CLIP) IMPLANT
ELECT REM PT RETURN 9FT ADLT (ELECTROSURGICAL)
ELECTRODE REM PT RTRN 9FT ADLT (ELECTROSURGICAL) IMPLANT
FCP BXJMBJMB 240X2.8X (CUTTING FORCEPS)
FLOOR PAD 36X40 (MISCELLANEOUS)
FORCEPS BIOP RAD 4 LRG CAP 4 (CUTTING FORCEPS) ×3 IMPLANT
FORCEPS BIOP RJ4 240 W/NDL (CUTTING FORCEPS)
FORCEPS BXJMBJMB 240X2.8X (CUTTING FORCEPS) IMPLANT
FORMALIN 10 PREFIL 20ML (MISCELLANEOUS) ×9 IMPLANT
INJECTOR/SNARE I SNARE (MISCELLANEOUS) IMPLANT
KIT CLEAN ENDO COMPLIANCE (KITS) ×3 IMPLANT
LUBRICANT JELLY 4.5OZ STERILE (MISCELLANEOUS) ×3 IMPLANT
MANIFOLD NEPTUNE II (INSTRUMENTS) ×3 IMPLANT
NEEDLE SCLEROTHERAPY 25GX240 (NEEDLE) IMPLANT
PAD FLOOR 36X40 (MISCELLANEOUS) IMPLANT
PROBE APC STR FIRE (PROBE) IMPLANT
PROBE INJECTION GOLD (MISCELLANEOUS)
PROBE INJECTION GOLD 7FR (MISCELLANEOUS) IMPLANT
SNARE ROTATE MED OVAL 20MM (MISCELLANEOUS) IMPLANT
SNARE SHORT THROW 13M SML OVAL (MISCELLANEOUS) IMPLANT
SYR 50ML LL SCALE MARK (SYRINGE) ×3 IMPLANT
SYR INFLATE BILIARY GAUGE (MISCELLANEOUS) IMPLANT
SYR INFLATION 60ML (SYRINGE) ×3 IMPLANT
TRAP SPECIMEN MUCOUS 40CC (MISCELLANEOUS) IMPLANT
TUBING IRRIGATION ENDOGATOR (MISCELLANEOUS) ×3 IMPLANT
WATER STERILE IRR 1000ML POUR (IV SOLUTION) ×3 IMPLANT

## 2014-06-04 NOTE — H&P (View-Only) (Signed)
  Primary Care Physician:  HARRIS,MEREDITH L, FNP Primary Gastroenterologist:  Dr. Rourk  Chief Complaint  Patient presents with  . Weight Loss  . Gastrophageal Reflux  . set up procedures    HPI:   64-year-old female presents her referral from PCP for consideration of colonoscopy/endoscopy. Reviewed PCP notes and per their note patient has had unintentional weight loss of greater than 20 pounds in the past year. Has been under a lot of stress and anxiety, but as this improved and the patient's by mouth intake improved she continued to lose weight and have fatigue. Last colonoscopy on file in our system is 04/29/2009 which found a 1 cm cecal polyp which was tubular adenoma on pathology. Endoscopy to same time with the stomach biopsy and small intestine biopsy showed mild chronic gastritis with no evidence of H. Pylori; benign small bowel mucosa no villous atrophy, inflammation, or other abnormalities present.  Today she states she's lost an additional 3 pounds since seeing her PCP. She's trying to put on weight but keeps losing. Also fatigue which has been stable over the past 2 months but still noticeable and requires frequent rests and a nap during the day. Her symptoms began about 8 months ago. Her stress level is much improved. Typically if she's stressed she "can't eat" however she is eating well. Admits abdominal pain in her lower abdomen, but admits she has a history of IBS and GERD. Admits some nausea, denies vomiting. Denies fever, chills. Denies hematochezia or melena. States her PCP did a stool/heme test, but no labs were sent and patient states she was not informed of the result. Takes BC powders daily. Also takes NSAIDs daily as well. Also admits some dysphagia in the past month or so. Has a bowel movement about every 1-2 days and is described as variable in consistency but passes easily. Has had a change in her bowel habits with increased frequency and increased proportion of diarrhea  stools. Denies thin stools (pencil or ribbon thin.) Denies any other upper lower GI problems.  Past Medical History  Diagnosis Date  . Generalized anxiety disorder   . Fatigue   . Weight loss   . Depression   . Arthritis   . PUD (peptic ulcer disease)   . Pneumonia   . Thyroid disorder     Past Surgical History  Procedure Laterality Date  . Thyroid surgery  1962    goiter removal  . Tubal ligation  1987  . Arm fracture repair  2009  . Colonoscopy  2011  . Esophagogastroduodenoscopy endoscopy  2011    Current Outpatient Prescriptions  Medication Sig Dispense Refill  . Aspirin-Salicylamide-Caffeine (BC HEADACHE POWDER PO) Take by mouth daily.    . fluticasone-salmeterol (ADVAIR HFA) 115-21 MCG/ACT inhaler Inhale 2 puffs into the lungs 2 (two) times daily.    . HYDROcodone-acetaminophen (NORCO) 7.5-325 MG per tablet     . LORazepam (ATIVAN) 0.5 MG tablet     . omeprazole (PRILOSEC) 20 MG capsule     . sertraline (ZOLOFT) 50 MG tablet      No current facility-administered medications for this visit.    Allergies as of 05/21/2014 - Review Complete 05/21/2014  Allergen Reaction Noted  . Atorvastatin      Family History  Problem Relation Age of Onset  . Colon cancer Neg Hx     History   Social History  . Marital Status: Single    Spouse Name: N/A  . Number of Children: N/A  .   Years of Education: N/A   Occupational History  . Not on file.   Social History Main Topics  . Smoking status: Current Every Day Smoker -- 1.00 packs/day    Types: Cigarettes  . Smokeless tobacco: Never Used  . Alcohol Use: No  . Drug Use: No  . Sexual Activity: Not on file   Other Topics Concern  . Not on file   Social History Narrative    Review of Systems: General: Positive for anorexia, weight loss, fatigue, weakness. Denies fever, chills. Eyes: Negative for vision changes.  ENT: Negative for nasal congestion. CV: Negative for chest pain, angina, palpitations, dyspnea on  exertion, peripheral edema.  Respiratory: Negative for dyspnea on exertion, cough, sputum, wheezing worse than baseline.  GI: See history of present illness. MS: Negative for joint pain, low back pain.  Derm: Negative for rash or itching.  Neuro: Negative for weakness, seizure, memory loss, confusion.  Psych: Negative for anxiety, depression.  Endo: Positive for unusual weight change.  Heme: Negative for bruising or bleeding. Allergy: Negative for rash or hives.   Physical Exam: BP 112/73 mmHg  Pulse 81  Temp(Src) 97.1 F (36.2 C)  Ht 5' 6" (1.676 m)  Wt 96 lb 9.6 oz (43.817 kg)  BMI 15.60 kg/m2 General:   Alert and oriented. Pleasant and cooperative. Well-nourished and well-developed.  Head:  Normocephalic and atraumatic. Eyes:  Without icterus, sclera clear and conjunctiva pink.  Ears:  Normal auditory acuity. Mouth:  No deformity or lesions, oral mucosa pink. No OP edema. Neck:  Supple, without mass or thyromegaly. Lungs:  Clear to auscultation bilaterally. No wheezes, rales, or rhonchi. No distress.  Heart:  S1, S2 present without murmurs appreciated.  Abdomen:  +BS, soft, and non-distended. Mild TTP lower abdomen and epigastric area. No HSM noted. No guarding or rebound. No masses appreciated.  Rectal:  Deferred  Msk:  Symmetrical without gross deformities. Normal posture. Pulses:  Normal DP pulses noted. Extremities:  Without clubbing or edema. Neurologic:  Alert and  oriented x4;  grossly normal neurologically. Skin:  Intact without significant lesions or rashes. Cervical Nodes:  No significant cervical adenopathy. Psych:  Alert and cooperative. Normal mood and affect.     05/21/2014 9:17 AM  

## 2014-06-04 NOTE — Anesthesia Procedure Notes (Signed)
Procedure Name: MAC Date/Time: 06/04/2014 11:53 AM Performed by: Franco NonesYATES, Kolden Dupee S Pre-anesthesia Checklist: Patient identified, Emergency Drugs available, Suction available, Timeout performed and Patient being monitored Patient Re-evaluated:Patient Re-evaluated prior to inductionOxygen Delivery Method: Non-rebreather mask

## 2014-06-04 NOTE — Op Note (Addendum)
[facility] [address] [place], [id number]   COLONOSCOPY PROCEDURE REPORT  PATIENT: Betty PilaBrown Zakayla E.  MR#: #[id number] BIRTHDATE: 09/28/1950 , 63  yrs. old GENDER: female ENDOSCOPIST: R.  Roetta SessionsMichael Tikita Mabee, MD FACP [facility] REFERRED BY:    Herbert DeanerMeredith Harris, NP PROCEDURE DATE:  06/04/2014 PROCEDURE:   Colonoscopy, surveillance INDICATIONS:    History of colonic adenoma MEDICATIONS: Deep sedation per Dr. [facility] ASA CLASS:       Class III  CONSENT: The risks, benefits, alternatives and imponderables including but not limited to bleeding, perforation as well as the possibility of a missed lesion have been reviewed.  The potential for biopsy, lesion removal, etc. have also been discussed. Questions have been answered.  All parties agreeable.  Please see the history and physical in the medical record for more information.  DESCRIPTION OF PROCEDURE:   After the risks benefits and alternatives of the procedure were thoroughly explained, informed consent was obtained.  The digital rectal exam revealed no rectal mass.   The     endoscope was introduced through the anus and advanced to the cecum, which was identified by both the appendix and ileocecal valve. No adverse events experienced.   The quality of the prep was adequate  The instrument was then slowly withdrawn as the colon was fully examined.      COLON FINDINGS: Anal papilla and internal hemorrhoids; otherwise, normal-appearing rectal mucosa.  Scattered left-sided diverticula; the remainder of the colonic mucosa appeared normal.  Retroflexion was performed. .  Withdrawal time=11 minutes 0 seconds.  The scope was withdrawn and the procedure completed. COMPLICATIONS: There were no immediate complications.  ENDOSCOPIC IMPRESSION: Colonic diverticulosis.  RECOMMENDATIONS: Repeat colonoscopy in 5 years for surveillance purposes. See EGD.  eSigned:  R. Roetta SessionsMichael Alondra Vandeven, MD Jerrel IvoryFACP [facility] 07/30/2014 3:38  PM Revised: 07/30/2014 3:38 PM  cc:  CPT CODES: ICD CODES:  The ICD and CPT codes recommended by this software are interpretations from the data that the clinical staff has captured with the software.  The verification of the translation of this report to the ICD and CPT codes and modifiers is the sole responsibility of the health care institution and practicing physician where this report was generated.  [employer] will not be held responsible for the validity of the ICD and CPT codes included on this report.  AMA assumes no liability for data contained or not contained herein. CPT is a Publishing rights managerregistered trademark of the [employer].

## 2014-06-04 NOTE — Anesthesia Postprocedure Evaluation (Signed)
  Anesthesia Post-op Note  Patient: Betty Brown  Procedure(s) Performed: Procedure(s): ESOPHAGOGASTRODUODENOSCOPY (EGD) WITH PROPOFOL (N/A) ESOPHAGEAL DILATION 52 FRENCH MALONEY (N/A) COLONOSCOPY WITH PROPOFOL; IN CECUM AT 1233; WITHDRAWAL TIME 11 MINUTES (N/A) BIOPSY (N/A)  Patient Location: PACU  Anesthesia Type:MAC  Level of Consciousness: awake  Airway and Oxygen Therapy: Patient Spontanous Breathing and Patient connected to nasal cannula oxygen  Post-op Pain: mild  Post-op Assessment: Post-op Vital signs reviewed, Patient's Cardiovascular Status Stable and Respiratory Function Stable  Post-op Vital Signs: Reviewed and stable  Last Vitals:  Filed Vitals:   06/04/14 1300  BP: 90/64  Pulse: 61  Temp:   Resp: 17    Complications: No apparent anesthesia complications

## 2014-06-04 NOTE — Interval H&P Note (Signed)
History and Physical Interval Note:  06/04/2014 11:47 AM  Betty Brown  has presented today for surgery, with the diagnosis of DYSPHAGIA/WEIGHT LOSS  The various methods of treatment have been discussed with the patient and family. After consideration of risks, benefits and other options for treatment, the patient has consented to  Procedure(s) with comments: COLONOSCOPY WITH PROPOFOL (N/A) - 1015 - moved to 11:30  ESOPHAGOGASTRODUODENOSCOPY (EGD) WITH PROPOFOL (N/A) ESOPHAGEAL DILATION (N/A) as a surgical intervention .  The patient's history has been reviewed, patient examined, no change in status, stable for surgery.  I have reviewed the patient's chart and labs.  Questions were answered to the patient's satisfaction.     Betty Brown  No change. EGD with possible esophageal dilation followed by surveillance colonoscopy per plan.  The risks, benefits, limitations, imponderables and alternatives regarding both EGD and colonoscopy have been reviewed with the patient. Questions have been answered. All parties agreeable.

## 2014-06-04 NOTE — Op Note (Addendum)
[facility] [address] [place], [id number]   ENDOSCOPY PROCEDURE REPORT  PATIENT: Betty PilaBrown Caroline E.  MR#: #[id number] BIRTHDATE: 1950/12/27 , 63  yrs. old GENDER: female ENDOSCOPIST: R.  Roetta SessionsMichael Anshu Wehner, MD FACP [facility] REFERRED BY:  Herbert DeanerMeredith Harris, NP PROCEDURE DATE:  06/04/2014 PROCEDURE:  EGD w/ biopsy and Maloney dilation of esophagus INDICATIONS:  Esophageal dysphagia; anorexia; weight loss NSAID abuse. MEDICATIONS: Deep sedation per Dr[facility]  [facility] ASA CLASS:      Class III  CONSENT: The risks, benefits, limitations, alternatives and imponderables have been discussed.  The potential for biopsy, esophogeal dilation, etc. have also been reviewed.  Questions have been answered.  All parties agreeable.  Please see the history and physical in the medical record for more information.  DESCRIPTION OF PROCEDURE: After the risks benefits and alternatives of the procedure were thoroughly explained, informed consent was obtained.  The    endoscope was introduced through the mouth and advanced to the second portion of the duodenum , limited by Without limitations. The instrument was slowly withdrawn as the mucosa was fully examined.    Esophageal mucosa inflamed?"distal two thirds.  Friable  mucosa with the thin ribbonlike plaques circumferentially.  No typical changes consistent with candidal esophagitis, however.  Esophagus appeared to be a little narrowed throughout its course but no focal stricture or neoplasm or other abnormality observed.  I did not identify Barrett's esophagus.  Stomach emptied diffuse mottled erythema one 5 mm prepyloric ulcer present.  No obvious infiltrating process Patent pylorus.  Normal first and second portion of the duodenum  Scope was withdrawn, subsequently, a 52 [place] Maloney dilators passed to full insertion easily.  A look back revealed no apparent complication related to this maneuver.  Subsequently,  biopsies  of the abnormal gastric mucosa and esophagus were taken.  Also, the esopohagus was brushed for KOH prep.  Patient tolerated procedure well.  Retroflexed views revealed a hiatal hernia.     The scope was then withdrawn from the patient and the procedure completed.  COMPLICATIONS: There were no immediate complications.  ENDOSCOPIC IMPRESSION: Abnormal esophagus as described above?"status post Maloney dilation and biopsy. Abnormal stomach. Prepyloric ulcer appeared status post biopsy.  RECOMMENDATIONS: Increase Prilosec to 20 mg twice daily. Wouldavoid all nonsteroidal agents. Follow-up on pending studies. Office visit in 3 months. Repeat EGD 3 months. See colonoscopy report.  REPEAT EXAM:  eSigned:  R. Roetta SessionsMichael Traquan Duarte, MD Jerrel IvoryFACP [facility] 07/30/2014 3:36 PM Revised: 07/30/2014 3:36 PM   CC:  CPT CODES: ICD CODES:  The ICD and CPT codes recommended by this software are interpretations from the data that the clinical staff has captured with the software.  The verification of the translation of this report to the ICD and CPT codes and modifiers is the sole responsibility of the health care institution and practicing physician where this report was generated.  [employer] will not be held responsible for the validity of the ICD and CPT codes included on this report.  AMA assumes no liability for data contained or not contained herein. CPT is a Publishing rights managerregistered trademark of the [employer].  PATIENT NAME:  Betty PilaBrown Zenab E. MR#: #[id number]

## 2014-06-04 NOTE — Discharge Instructions (Signed)
Colonoscopy Discharge Instructions  Read the instructions outlined below and refer to this sheet in the next few weeks. These discharge instructions provide you with general information on caring for yourself after you leave the hospital. Your doctor may also give you specific instructions. While your treatment has been planned according to the most current medical practices available, unavoidable complications occasionally occur. If you have any problems or questions after discharge, call Dr. Gala Romney at [phone]. ACTIVITY  You may resume your regular activity, but move at a slower pace for the next 24 hours.   Take frequent rest periods for the next 24 hours.   Walking will help get rid of the air and reduce the bloated feeling in your belly (abdomen).   No driving for 24 hours (because of the medicine (anesthesia) used during the test).    Do not sign any important legal documents or operate any machinery for 24 hours (because of the anesthesia used during the test).  NUTRITION  Drink plenty of fluids.   You may resume your normal diet as instructed by your doctor.   Begin with a light meal and progress to your normal diet. Heavy or fried foods are harder to digest and may make you feel sick to your stomach (nauseated).   Avoid alcoholic beverages for 24 hours or as instructed.  MEDICATIONS  You may resume your normal medications unless your doctor tells you otherwise.  WHAT YOU CAN EXPECT TODAY  Some feelings of bloating in the abdomen.   Passage of more gas than usual.   Spotting of blood in your stool or on the toilet paper.  IF YOU HAD POLYPS REMOVED DURING THE COLONOSCOPY:  No aspirin products for 7 days or as instructed.   No alcohol for 7 days or as instructed.   Eat a soft diet for the next 24 hours.  FINDING OUT THE RESULTS OF YOUR TEST Not all test results are available during your visit. If your test results are not back during the visit, make an appointment  with your caregiver to find out the results. Do not assume everything is normal if you have not heard from your caregiver or the medical facility. It is important for you to follow up on all of your test results.  SEEK IMMEDIATE MEDICAL ATTENTION IF:  You have more than a spotting of blood in your stool.   Your belly is swollen (abdominal distention).   You are nauseated or vomiting.   You have a temperature over 101.   You have abdominal pain or discomfort that is severe or gets worse throughout the day.   EGD Discharge instructions Please read the instructions outlined below and refer to this sheet in the next few weeks. These discharge instructions provide you with general information on caring for yourself after you leave the hospital. Your doctor may also give you specific instructions. While your treatment has been planned according to the most current medical practices available, unavoidable complications occasionally occur. If you have any problems or questions after discharge, please call your doctor. ACTIVITY  You may resume your regular activity but move at a slower pace for the next 24 hours.   Take frequent rest periods for the next 24 hours.   Walking will help expel (get rid of) the air and reduce the bloated feeling in your abdomen.   No driving for 24 hours (because of the anesthesia (medicine) used during the test).   You may shower.   Do not sign any  important legal documents or operate any machinery for 24 hours (because of the anesthesia used during the test).  NUTRITION  Drink plenty of fluids.   You may resume your normal diet.   Begin with a light meal and progress to your normal diet.   Avoid alcoholic beverages for 24 hours or as instructed by your caregiver.  MEDICATIONS  You may resume your normal medications unless your caregiver tells you otherwise.  WHAT YOU CAN EXPECT TODAY  You may experience abdominal discomfort such as a feeling of  fullness or gas pains.  FOLLOW-UP  Your doctor will discuss the results of your test with you.  SEEK IMMEDIATE MEDICAL ATTENTION IF ANY OF THE FOLLOWING OCCUR:  Excessive nausea (feeling sick to your stomach) and/or vomiting.   Severe abdominal pain and distention (swelling).   Trouble swallowing.   Temperature over 101 F (37.8 C).   Rectal bleeding or vomiting of blood.      Increase Prilosec to 20 mg twice daily  Avoid all forms of aspirin and nonsteroidal agents such as Goody powders, Advil and Aleve  Office visit with us in 3 months  Further recommendations to follow once laboratory test results are back for review

## 2014-06-04 NOTE — Transfer of Care (Signed)
Immediate Anesthesia Transfer of Care Note  Patient: Betty Brown  Procedure(s) Performed: Procedure(s): ESOPHAGOGASTRODUODENOSCOPY (EGD) WITH PROPOFOL (N/A) ESOPHAGEAL DILATION 52 FRENCH MALONEY (N/A) COLONOSCOPY WITH PROPOFOL; IN CECUM AT 1233; WITHDRAWAL TIME 11 MINUTES (N/A) BIOPSY (N/A)  Patient Location: PACU  Anesthesia Type:MAC  Level of Consciousness: awake and patient cooperative  Airway & Oxygen Therapy: Patient Spontanous Breathing and non-rebreather face mask  Post-op Assessment: Report given to RN, Post -op Vital signs reviewed and stable and Patient moving all extremities  Post vital signs: Reviewed and stable   Complications: No apparent anesthesia complications

## 2014-06-04 NOTE — Anesthesia Preprocedure Evaluation (Addendum)
Anesthesia Evaluation  Patient identified by MRN, date of birth, ID band Patient awake    Reviewed: Allergy & Precautions, NPO status , Patient's Chart, lab work & pertinent test results  Airway Mallampati: I  TM Distance: >3 FB     Dental  (+) Teeth Intact   Pulmonary COPDCurrent Smoker,  breath sounds clear to auscultation        Cardiovascular Rhythm:Regular Rate:Normal     Neuro/Psych  Headaches, PSYCHIATRIC DISORDERS Anxiety Depression    GI/Hepatic PUD, GERD-  ,  Endo/Other  Hypothyroidism   Renal/GU      Musculoskeletal  (+) Arthritis -,   Abdominal   Peds  Hematology  (+) anemia ,   Anesthesia Other Findings   Reproductive/Obstetrics                            Anesthesia Physical Anesthesia Plan  ASA: III  Anesthesia Plan: MAC   Post-op Pain Management:    Induction: Intravenous  Airway Management Planned: Simple Face Mask  Additional Equipment:   Intra-op Plan:   Post-operative Plan:   Informed Consent: I have reviewed the patients History and Physical, chart, labs and discussed the procedure including the risks, benefits and alternatives for the proposed anesthesia with the patient or authorized representative who has indicated his/her understanding and acceptance.     Plan Discussed with:   Anesthesia Plan Comments:         Anesthesia Quick Evaluation

## 2014-06-05 ENCOUNTER — Encounter (HOSPITAL_COMMUNITY): Payer: Self-pay | Admitting: Internal Medicine

## 2014-06-05 MED ORDER — OMEPRAZOLE 20 MG PO CPDR: 20.0000 mg | DELAYED_RELEASE_CAPSULE | Freq: Two times a day (BID) | ORAL | Status: DC

## 2014-06-08 ENCOUNTER — Other Ambulatory Visit (HOSPITAL_COMMUNITY): Payer: Self-pay

## 2014-06-09 ENCOUNTER — Encounter: Payer: Self-pay | Admitting: Internal Medicine

## 2014-06-10 ENCOUNTER — Telehealth: Payer: Self-pay

## 2014-06-10 NOTE — Telephone Encounter (Signed)
Per RMR-  Send letter to patient.  Send copy of letter with path to referring provider and PCP. Ov w extender in 6 weeks.   rx of yeast infection to follow

## 2014-06-10 NOTE — Telephone Encounter (Signed)
PATIENT HAS APPOINTMENT  °

## 2014-06-10 NOTE — Telephone Encounter (Signed)
Letter mailed to the pt. See lab results for rx.

## 2014-06-11 ENCOUNTER — Telehealth: Payer: Self-pay | Admitting: Internal Medicine

## 2014-06-11 ENCOUNTER — Other Ambulatory Visit: Payer: Self-pay | Admitting: Internal Medicine

## 2014-06-11 MED ORDER — OMEPRAZOLE 20 MG PO CPDR: 20.0000 mg | DELAYED_RELEASE_CAPSULE | Freq: Two times a day (BID) | ORAL | Status: DC

## 2014-06-11 MED ORDER — FLUCONAZOLE 100 MG PO TABS: ORAL_TABLET | ORAL | Status: DC

## 2014-06-11 NOTE — Telephone Encounter (Signed)
Reviewed RMR procedure note and discharge instructions. Pt was told to increase her prilosec to bid. Per AS ok to send this in with refills to last until her ov in July. Pt is aware of her ov. Pt is also aware of bx results and diflucan rx.

## 2014-06-11 NOTE — Telephone Encounter (Signed)
Patient called today to ask about her Prilosec prescription. She said that RMR said to increase it to 20 mg twice a day  From 20 mg once a day. She said that he didn't give her a prescription and the pharmacy doesn't have anything from us saying to increase the dose. She is asking should her PCP call this in or will RMR call it in. Please call patient at [phone]

## 2014-09-03 ENCOUNTER — Ambulatory Visit (INDEPENDENT_AMBULATORY_CARE_PROVIDER_SITE_OTHER): Payer: BC Managed Care – PPO | Admitting: Nurse Practitioner

## 2014-09-03 ENCOUNTER — Encounter: Payer: Self-pay | Admitting: Nurse Practitioner

## 2014-09-03 ENCOUNTER — Other Ambulatory Visit: Payer: Self-pay

## 2014-09-03 VITALS — BP 154/91 | HR 73 | Temp 97.1°F | Ht 67.0 in | Wt 94.6 lb

## 2014-09-03 DIAGNOSIS — K259 Gastric ulcer, unspecified as acute or chronic, without hemorrhage or perforation: Secondary | ICD-10-CM | POA: Insufficient documentation

## 2014-09-03 DIAGNOSIS — B3781 Candidal esophagitis: Secondary | ICD-10-CM | POA: Diagnosis not present

## 2014-09-03 DIAGNOSIS — R1314 Dysphagia, pharyngoesophageal phase: Secondary | ICD-10-CM | POA: Diagnosis not present

## 2014-09-03 MED ORDER — OMEPRAZOLE 20 MG PO CPDR: 20.0000 mg | DELAYED_RELEASE_CAPSULE | Freq: Two times a day (BID) | ORAL | Status: AC

## 2014-09-03 NOTE — Assessment & Plan Note (Signed)
Patient with recent endoscopy which showed a prepyloric ulcer status post biopsy and on pathology showed mild chronic inactive gastritis. Recommendations with EGD is to repeat the procedure in 3 months (now) to reevaluate abnormal-appearing esophagus which was positive for candida esophagitis. Her symptoms are much improved. We'll proceed with planned repeat.  Proceed with EGD in the OR on propofol with Dr. Jena Gauss in near future: the risks, benefits, and alternatives have been discussed with the patient in detail. The patient states understanding and desires to proceed.  Patient is on chronic pain medications and last procedure was completed under MAC with propofol. We'll plan to repeat this procedure in the same manner.

## 2014-09-03 NOTE — Progress Notes (Signed)
Referring Provider: Altamease Oiler, FNP Primary Care Physician:  Altamease Oiler, FNP Primary GI:  Dr. Jena Gauss  Chief Complaint  Patient presents with  . Follow-up    HPI:   64 year old female presents for follow-up on endoscopy and colonoscopy. EGD with Maloney dilation performed 06/04/2014 for dysphagia, anorexia, weight loss, and NSAID abuse. This is done under deep sedation with findings including inflamed esophageal mucosa distal two thirds of friable mucosa and thin ribbonlike plaques circumferentially but no consistency with candidal esophagitis. Esophagus was narrowed but no focal stricture or neoplasm, no Barrett's identified, stomach empty to diffuse mottled erythema with one 5 mm prepyloric ulcer. Biopsies were taking and there is a brushing for KOH prep. Recommend increase Prilosec to 20 mg twice a day, avoid all NSAIDs, office visit in 3 months and repeat EGD in 3 months. She'll brushing found [place] aceto-inpatient which treated with Diflucan. Stomach biopsy with mild chronic inactive gastritis esophageal biopsy was superficial fragments of benign squamous mucosa. Colonoscopy completed on the same day as the same deep sedation for history of adenoma found clonic diverticulosis. Repeat colonoscopy in 5 years for surveillance.  Today she states she's doing better. Improved GERD symptoms as are dysphagia symptoms. Abdominal pain is much improved. States she's "still not exactly perfect and don't think I'll ever be, but I dont have the pain and problems I did." Improved belching and no more constant bloating. States she's still under a lot of stress. Denies N/V, hematochezia, melena. Denies chest pain, dyspnea, dizziness, lightheadedness, syncope, near syncope. Denies any other upper or lower GI symptoms.  Past Medical History  Diagnosis Date  . Generalized anxiety disorder   . Fatigue   . Weight loss   . Depression   . Arthritis   . PUD (peptic ulcer disease)   . Pneumonia     . Thyroid disorder     Past Surgical History  Procedure Laterality Date  . Thyroid surgery  1962    goiter removal  . Tubal ligation  1987  . Arm fracture repair  2009  . Colonoscopy  2011  . Esophagogastroduodenoscopy endoscopy  2011  . Esophagogastroduodenoscopy (egd) with propofol N/A 06/04/2014    [id number] esophagus s/p dilation  . Esophageal dilation N/A 06/04/2014    Procedure: ESOPHAGEAL DILATION 52 FRENCH MALONEY;  Surgeon: Corbin Ade, MD;  Location: [facility];  Service: Endoscopy;  Laterality: N/A;  . Colonoscopy with propofol N/A 06/04/2014    [id number] Diverticulosis  . Esophageal biopsy N/A 06/04/2014    Procedure: BIOPSY;  Surgeon: Corbin Ade, MD;  Location: [facility];  Service: Endoscopy;  Laterality: N/A;    Current Outpatient Prescriptions  Medication Sig Dispense Refill  . Aspirin-Salicylamide-Caffeine (BC HEADACHE POWDER PO) Take by mouth daily.    . fluticasone-salmeterol (ADVAIR HFA) 115-21 MCG/ACT inhaler Inhale 2 puffs into the lungs 2 (two) times daily.    Marland Kitchen HYDROcodone-acetaminophen (NORCO) 7.5-325 MG per tablet     . LORazepam (ATIVAN) 0.5 MG tablet     . omeprazole (PRILOSEC) 20 MG capsule Take 1 capsule (20 mg total) by mouth 2 (two) times daily before a meal. 60 capsule 3  . sertraline (ZOLOFT) 50 MG tablet     . fluconazole (DIFLUCAN) 100 MG tablet Take  on day 1 then take  daily for 21 days (Patient not taking: Reported on 09/03/2014) 23 tablet 0  . omeprazole (PRILOSEC) 20 MG capsule     . omeprazole (PRILOSEC) 20 MG capsule Take 1 capsule (20  mg total) by mouth 2 (two) times daily before a meal. (Patient not taking: Reported on 09/03/2014) 60 capsule 3  . polyethylene glycol-electrolytes (TRILYTE) 420 G solution Take 4,000 mLs by mouth as directed. (Patient not taking: Reported on 09/03/2014) 4000 mL 0   No current facility-administered medications for this visit.    Allergies as of 09/03/2014 - Review Complete 09/03/2014  Allergen  Reaction Noted  . Atorvastatin      Family History  Problem Relation Age of Onset  . Colon cancer Neg Hx   . Heart attack Mother     History   Social History  . Marital Status: Single    Spouse Name: N/A  . Number of Children: N/A  . Years of Education: N/A   Social History Main Topics  . Smoking status: Current Every Day Smoker -- 1.00 packs/day for 30 years    Types: Cigarettes  . Smokeless tobacco: Never Used  . Alcohol Use: No  . Drug Use: No  . Sexual Activity: Not on file   Other Topics Concern  . None   Social History Narrative    Review of Systems: General: Negative for anorexia, fever, chills, fatigue, weakness. ENT: Negative for hoarseness, difficulty swallowing. CV: Negative for chest pain, angina, palpitations, dyspnea on exertion, peripheral edema.  Respiratory: Negative for dyspnea at rest, dyspnea on exertion, cough, sputum, wheezing.  GI: See history of present illness. Endo: Negative for unusual weight change. Continued difficulty putting on weight; Thyropid monitored by PCP.  Heme: Negative for bruising or bleeding.   Physical Exam: BP 154/91 mmHg  Pulse 73  Temp(Src) 97.1 F (36.2 C) (Oral)  Ht  (1.702 m)  Wt 94 lb 9.6 oz (42.91 kg)  BMI 14.81 kg/m2 General:   Alert and oriented. Pleasant and cooperative. Well-nourished and well-developed. Thin. Head:  Normocephalic and atraumatic. Eyes:  Without icterus, sclera clear and conjunctiva pink.  Ears:  Normal auditory acuity. Cardiovascular:  S1, S2 present without murmurs appreciated. Normal pulses noted. Extremities without clubbing or edema. Respiratory:  Clear to auscultation bilaterally. No wheezes, rales, or rhonchi. No distress.  Gastrointestinal:  +BS, soft, non-tender and non-distended. No HSM noted. No guarding or rebound. No masses appreciated.  Rectal:  Deferred  Neurologic:  Alert and oriented x4;  grossly normal neurologically. Psych:  Alert and cooperative. Normal mood and  affect. Heme/Lymph/Immune: No excessive bruising noted.    09/03/2014 8:26 AM

## 2014-09-03 NOTE — Patient Instructions (Signed)
1. Continue taking her Prilosec twice a day. 2. We will schedule your procedure for you. 3. Further recommendations to be based on the results of your procedure.

## 2014-09-03 NOTE — Assessment & Plan Note (Signed)
Patient with dysphagia status post Maloney dilation and treatment for candida esophagitis. Symptoms are much improved per the patient. EGD recommendations include repeat endoscopy in 3 months (since now). We will plan for repeat endoscopy for reevaluation of abnormal-appearing esophagus per recommendations.  Proceed with EGD in the OR on propofol with Dr. Jena Gauss in near future: the risks, benefits, and alternatives have been discussed with the patient in detail. The patient states understanding and desires to proceed.  Patient is on chronic pain medications and last procedure was completed under MAC with propofol. We'll plan to repeat this procedure in the same manner.

## 2014-09-03 NOTE — Assessment & Plan Note (Signed)
Patient with esophageal mucosa inflammation distal two thirds and friable mucosa within ribbonlike plaques circumferentially on last endoscopy 3 months ago. Brushings taken and showed Candida esophagitis and the patient was treated with Diflucan. She states her symptoms including GERD-like symptoms, dysphagia, excessive belching, and bloating have all improved significantly. Recommendations on last EGD to repeat the procedure in 3 months for reevaluation. At this point we will move forward with planned repeat endoscopy.  Proceed with EGD in the OR on propofol with Dr. Jena Gauss in near future: the risks, benefits, and alternatives have been discussed with the patient in detail. The patient states understanding and desires to proceed.  Patient is on chronic pain medications and last procedure was completed under MAC with propofol. We'll plan to repeat this procedure in the same manner.

## 2014-09-07 NOTE — Progress Notes (Signed)
CC'ED TO PCP 

## 2014-09-25 NOTE — Patient Instructions (Addendum)
BROOKLINN LONGBOTTOM  09/25/2014     @PREFPERIOPPHARMACY @   Your procedure is scheduled on 10/01/2014  Report to [facility] at 8:45 A.M.  Call this number if you have problems the morning of surgery:  [phone]   Remember:  Do not eat food or drink liquids after midnight.  Take these medicines the morning of surgery with A SIP OF WATER Advair, Hydrocodone, Ativan, Zoloft, Prilosec   Do not wear jewelry, make-up or nail polish.  Do not wear lotions, powders, or perfumes.  You may wear deodorant.  Do not shave 48 hours prior to surgery.  Men may shave face and neck.  Do not bring valuables to the hospital.  [facility] is not responsible for any belongings or valuables.  Contacts, dentures or bridgework may not be worn into surgery.  Leave your suitcase in the car.  After surgery it may be brought to your room.  For patients admitted to the hospital, discharge time will be determined by your treatment team.  Patients discharged the day of surgery will not be allowed to drive home.   Please read over the following fact sheets that you were given. Anesthesia Post-op Instructions       PATIENT INSTRUCTIONS POST-ANESTHESIA  IMMEDIATELY FOLLOWING SURGERY:  Do not drive or operate machinery for the first twenty four hours after surgery.  Do not make any important decisions for twenty four hours after surgery or while taking narcotic pain medications or sedatives.  If you develop intractable nausea and vomiting or a severe headache please notify your doctor immediately.  FOLLOW-UP:  Please make an appointment with your surgeon as instructed. You do not need to follow up with anesthesia unless specifically instructed to do so.  WOUND CARE INSTRUCTIONS (if applicable):  Keep a dry clean dressing on the anesthesia/puncture wound site if there is drainage.  Once the wound has quit draining you may leave it open to air.  Generally you should leave the bandage intact for twenty four hours  unless there is drainage.  If the epidural site drains for more than 36-48 hours please call the anesthesia department.  QUESTIONS?:  Please feel free to call your physician or the hospital operator if you have any questions, and they will be happy to assist you.      Esophagogastroduodenoscopy Esophagogastroduodenoscopy (EGD) is a procedure to examine the lining of the esophagus, stomach, and first part of the small intestine (duodenum). A long, flexible, lighted tube with a camera attached (endoscope) is inserted down the throat to view these organs. This procedure is done to detect problems or abnormalities, such as inflammation, bleeding, ulcers, or growths, in order to treat them. The procedure lasts about 5-20 minutes. It is usually an outpatient procedure, but it may need to be performed in emergency cases in the hospital. LET YOUR CAREGIVER KNOW ABOUT:   Allergies to food or medicine.  All medicines you are taking, including vitamins, herbs, eyedrops, and over-the-counter medicines and creams.  Use of steroids (by mouth or creams).  Previous problems you or members of your family have had with the use of anesthetics.  Any blood disorders you have.  Previous surgeries you have had.  Other health problems you have.  Possibility of pregnancy, if this applies. RISKS AND COMPLICATIONS  Generally, EGD is a safe procedure. However, as with any procedure, complications can occur. Possible complications include:  Infection.  Bleeding.  Tearing (perforation) of the esophagus, stomach, or duodenum.  Difficulty breathing or not  being able to breath.  Excessive sweating.  Spasms of the larynx.  Slowed heartbeat.  Low blood pressure. BEFORE THE PROCEDURE  Do not eat or drink anything for 6-8 hours before the procedure or as directed by your caregiver.  Ask your caregiver about changing or stopping your regular medicines.  If you wear dentures, be prepared to remove them  before the procedure.  Arrange for someone to drive you home after the procedure. PROCEDURE   A vein will be accessed to give medicines and fluids. A medicine to relax you (sedative) and a pain reliever will be given through that access into the vein.  A numbing medicine (local anesthetic) may be sprayed on your throat for comfort and to stop you from gagging or coughing.  A mouth guard may be placed in your mouth to protect your teeth and to keep you from biting on the endoscope.  You will be asked to lie on your left side.  The endoscope is inserted down your throat and into the esophagus, stomach, and duodenum.  Air is put through the endoscope to allow your caregiver to view the lining of your esophagus clearly.  The esophagus, stomach, and duodenum is then examined. During the exam, your caregiver may:  Remove tissue to be examined under a microscope (biopsy) for inflammation, infection, or other medical problems.  Remove growths.  Remove objects (foreign bodies) that are stuck.  Treat any bleeding with medicines or other devices that stop tissues from bleeding (hot cautery, clipping devices).  Widen (dilate) or stretch narrowed areas of the esophagus and stomach.  The endoscope will then be withdrawn. AFTER THE PROCEDURE  You will be taken to a recovery area to be monitored. You will be able to go home once you are stable and alert.  Do not eat or drink anything until the local anesthetic and numbing medicines have worn off. You may choke.  It is normal to feel bloated, have pain with swallowing, or have a sore throat for a short time. This will wear off.  Your caregiver should be able to discuss his or her findings with you. It will take longer to discuss the test results if any biopsies were taken. Document Released: 05/26/2004 Document Revised: 06/09/2013 Document Reviewed: 12/27/2011 [facility] Patient Information 2015 [place], [place]. This information is not intended  to replace advice given to you by your health care provider. Make sure you discuss any questions you have with your health care provider.

## 2014-09-28 ENCOUNTER — Encounter (HOSPITAL_COMMUNITY)
Admission: RE | Admit: 2014-09-28 | Discharge: 2014-09-28 | Disposition: A | Discharge status: Home / Self Care | Payer: BC Managed Care – PPO | Source: Ambulatory Visit | Attending: Internal Medicine | Admitting: Internal Medicine

## 2014-09-28 ENCOUNTER — Encounter (HOSPITAL_COMMUNITY): Payer: Self-pay

## 2014-09-28 DIAGNOSIS — K219 Gastro-esophageal reflux disease without esophagitis: Secondary | ICD-10-CM | POA: Diagnosis not present

## 2014-09-28 DIAGNOSIS — F17210 Nicotine dependence, cigarettes, uncomplicated: Secondary | ICD-10-CM | POA: Diagnosis not present

## 2014-09-28 DIAGNOSIS — R109 Unspecified abdominal pain: Secondary | ICD-10-CM | POA: Diagnosis not present

## 2014-09-28 DIAGNOSIS — F329 Major depressive disorder, single episode, unspecified: Secondary | ICD-10-CM | POA: Diagnosis not present

## 2014-09-28 DIAGNOSIS — R1310 Dysphagia, unspecified: Secondary | ICD-10-CM | POA: Diagnosis not present

## 2014-09-28 DIAGNOSIS — E079 Disorder of thyroid, unspecified: Secondary | ICD-10-CM | POA: Diagnosis not present

## 2014-09-28 DIAGNOSIS — Z79899 Other long term (current) drug therapy: Secondary | ICD-10-CM | POA: Diagnosis not present

## 2014-09-28 DIAGNOSIS — F411 Generalized anxiety disorder: Secondary | ICD-10-CM | POA: Diagnosis not present

## 2014-09-28 DIAGNOSIS — K259 Gastric ulcer, unspecified as acute or chronic, without hemorrhage or perforation: Secondary | ICD-10-CM | POA: Diagnosis present

## 2014-09-28 LAB — BASIC METABOLIC PANEL
Anion gap: 8 (ref 5–15)
BUN: 9 mg/dL (ref 6–20)
CALCIUM: 9.1 mg/dL (ref 8.9–10.3)
CHLORIDE: 103 mmol/L (ref 101–111)
CO2: 26 mmol/L (ref 22–32)
Creatinine, Ser: 0.64 mg/dL (ref 0.44–1.00)
GFR calc Af Amer: >60 mL/min (ref >60)
GFR calc non Af Amer: >60 mL/min (ref >60)
GLUCOSE: 57 mg/dL — AB (ref 65–99)
Potassium: 4.4 mmol/L (ref 3.5–5.1)
Sodium: 137 mmol/L (ref 135–145)

## 2014-09-28 LAB — CBC
HCT: 35.1 % — ABNORMAL LOW (ref 36.0–46.0)
Hemoglobin: 11.8 g/dL — ABNORMAL LOW (ref 12.0–15.0)
MCH: 32.6 pg (ref 26.0–34.0)
MCHC: 33.6 g/dL (ref 30.0–36.0)
MCV: 97 fL (ref 78.0–100.0)
Platelets: 271 10*3/uL (ref 150–400)
RBC: 3.62 MIL/uL — ABNORMAL LOW (ref 3.87–5.11)
RDW: 14.2 % (ref 11.5–15.5)
WBC: 6.8 10*3/uL (ref 4.0–10.5)

## 2014-10-01 ENCOUNTER — Ambulatory Visit (HOSPITAL_COMMUNITY)
Admission: RE | Admit: 2014-10-01 | Discharge: 2014-10-01 | Disposition: A | Discharge status: Home / Self Care | Payer: BC Managed Care – PPO | Source: Ambulatory Visit | Attending: Internal Medicine | Admitting: Internal Medicine

## 2014-10-01 ENCOUNTER — Encounter (HOSPITAL_COMMUNITY)
Admission: RE | Disposition: A | Discharge status: Home / Self Care | Payer: Self-pay | Source: Ambulatory Visit | Attending: Internal Medicine

## 2014-10-01 ENCOUNTER — Encounter (HOSPITAL_COMMUNITY): Payer: Self-pay | Admitting: *Deleted

## 2014-10-01 ENCOUNTER — Ambulatory Visit (HOSPITAL_COMMUNITY): Payer: BC Managed Care – PPO | Admitting: Anesthesiology

## 2014-10-01 DIAGNOSIS — K219 Gastro-esophageal reflux disease without esophagitis: Secondary | ICD-10-CM | POA: Insufficient documentation

## 2014-10-01 DIAGNOSIS — E079 Disorder of thyroid, unspecified: Secondary | ICD-10-CM | POA: Insufficient documentation

## 2014-10-01 DIAGNOSIS — F329 Major depressive disorder, single episode, unspecified: Secondary | ICD-10-CM | POA: Insufficient documentation

## 2014-10-01 DIAGNOSIS — R1310 Dysphagia, unspecified: Secondary | ICD-10-CM | POA: Insufficient documentation

## 2014-10-01 DIAGNOSIS — F411 Generalized anxiety disorder: Secondary | ICD-10-CM | POA: Insufficient documentation

## 2014-10-01 DIAGNOSIS — K229 Disease of esophagus, unspecified: Secondary | ICD-10-CM | POA: Diagnosis not present

## 2014-10-01 DIAGNOSIS — K259 Gastric ulcer, unspecified as acute or chronic, without hemorrhage or perforation: Secondary | ICD-10-CM | POA: Diagnosis not present

## 2014-10-01 DIAGNOSIS — F17210 Nicotine dependence, cigarettes, uncomplicated: Secondary | ICD-10-CM | POA: Insufficient documentation

## 2014-10-01 DIAGNOSIS — Z79899 Other long term (current) drug therapy: Secondary | ICD-10-CM | POA: Insufficient documentation

## 2014-10-01 DIAGNOSIS — R109 Unspecified abdominal pain: Secondary | ICD-10-CM | POA: Insufficient documentation

## 2014-10-01 HISTORY — PX: BIOPSY: SHX5522

## 2014-10-01 HISTORY — PX: ESOPHAGOGASTRODUODENOSCOPY (EGD) WITH PROPOFOL: SHX5813

## 2014-10-01 SURGERY — ESOPHAGOGASTRODUODENOSCOPY (EGD) WITH PROPOFOL
Anesthesia: Monitor Anesthesia Care

## 2014-10-01 MED ORDER — FENTANYL CITRATE (PF) 100 MCG/2ML IJ SOLN
INTRAMUSCULAR | Status: AC
  Filled 2014-10-01: qty 2

## 2014-10-01 MED ORDER — ONDANSETRON HCL 4 MG/2ML IJ SOLN: 4.0000 mg | Freq: Once | INTRAMUSCULAR | Status: DC | PRN

## 2014-10-01 MED ORDER — LIDOCAINE VISCOUS 2 % MT SOLN
OROMUCOSAL | Status: AC
  Filled 2014-10-01: qty 15

## 2014-10-01 MED ORDER — MIDAZOLAM HCL 2 MG/2ML IJ SOLN
1.0000 mg | INTRAMUSCULAR | Status: DC | PRN
  Administered 2014-10-01: 2 mg via INTRAVENOUS

## 2014-10-01 MED ORDER — LIDOCAINE HCL (CARDIAC) 10 MG/ML IV SOLN
INTRAVENOUS | Status: DC | PRN
  Administered 2014-10-01: 50 mg via INTRAVENOUS

## 2014-10-01 MED ORDER — PROPOFOL INFUSION 10 MG/ML OPTIME
INTRAVENOUS | Status: DC | PRN
  Administered 2014-10-01: 100 ug/kg/min via INTRAVENOUS

## 2014-10-01 MED ORDER — ONDANSETRON HCL 4 MG/2ML IJ SOLN
INTRAMUSCULAR | Status: AC
  Filled 2014-10-01: qty 2

## 2014-10-01 MED ORDER — STERILE WATER FOR IRRIGATION IR SOLN
Status: DC | PRN
  Administered 2014-10-01: 1000 mL

## 2014-10-01 MED ORDER — LIDOCAINE VISCOUS 2 % MT SOLN
15.0000 mL | Freq: Once | OROMUCOSAL | Status: AC
  Administered 2014-10-01: 15 mL via OROMUCOSAL

## 2014-10-01 MED ORDER — MIDAZOLAM HCL 2 MG/2ML IJ SOLN
INTRAMUSCULAR | Status: AC
  Filled 2014-10-01: qty 2

## 2014-10-01 MED ORDER — PROPOFOL 10 MG/ML IV BOLUS
INTRAVENOUS | Status: DC | PRN
  Administered 2014-10-01: 10 mg via INTRAVENOUS

## 2014-10-01 MED ORDER — FENTANYL CITRATE (PF) 100 MCG/2ML IJ SOLN
25.0000 ug | INTRAMUSCULAR | Status: AC
  Administered 2014-10-01 (×2): 25 ug via INTRAVENOUS

## 2014-10-01 MED ORDER — ONDANSETRON HCL 4 MG/2ML IJ SOLN
4.0000 mg | Freq: Once | INTRAMUSCULAR | Status: AC
  Administered 2014-10-01: 4 mg via INTRAVENOUS

## 2014-10-01 MED ORDER — FENTANYL CITRATE (PF) 100 MCG/2ML IJ SOLN: 25.0000 ug | INTRAMUSCULAR | Status: DC | PRN

## 2014-10-01 MED ORDER — LACTATED RINGERS IV SOLN
INTRAVENOUS | Status: DC
  Administered 2014-10-01: 10:00:00 via INTRAVENOUS

## 2014-10-01 SURGICAL SUPPLY — 19 items
BLOCK BITE 60FR ADLT L/F BLUE (MISCELLANEOUS) ×3 IMPLANT
DEVICE CLIP HEMOSTAT 235CM (CLIP) IMPLANT
ELECT REM PT RETURN 9FT ADLT (ELECTROSURGICAL)
ELECTRODE REM PT RTRN 9FT ADLT (ELECTROSURGICAL) IMPLANT
FLOOR PAD 36X40 (MISCELLANEOUS)
FORCEPS BIOP RAD 4 LRG CAP 4 (CUTTING FORCEPS) ×3 IMPLANT
FORMALIN 10 PREFIL 20ML (MISCELLANEOUS) ×3 IMPLANT
KIT ENDO PROCEDURE PEN (KITS) ×3 IMPLANT
MANIFOLD NEPTUNE II (INSTRUMENTS) ×3 IMPLANT
NEEDLE SCLEROTHERAPY 25GX240 (NEEDLE) IMPLANT
PAD FLOOR 36X40 (MISCELLANEOUS) IMPLANT
PROBE APC STR FIRE (PROBE) IMPLANT
PROBE INJECTION GOLD (MISCELLANEOUS)
PROBE INJECTION GOLD 7FR (MISCELLANEOUS) IMPLANT
SNARE ROTATE MED OVAL 20MM (MISCELLANEOUS) IMPLANT
SNARE SHORT THROW 13M SML OVAL (MISCELLANEOUS) IMPLANT
SYR INFLATION 60ML (SYRINGE) ×3 IMPLANT
TUBING IRRIGATION ENDOGATOR (MISCELLANEOUS) ×3 IMPLANT
WATER STERILE IRR 1000ML POUR (IV SOLUTION) ×3 IMPLANT

## 2014-10-01 NOTE — Discharge Instructions (Signed)

## 2014-10-01 NOTE — Anesthesia Postprocedure Evaluation (Signed)
  Anesthesia Post-op Note  Patient: Betty Brown  Procedure(s) Performed: Procedure(s): ESOPHAGOGASTRODUODENOSCOPY (EGD) WITH PROPOFOL (N/A) BIOPSY (N/A)  Patient Location: PACU  Anesthesia Type:MAC  Level of Consciousness: awake and patient cooperative  Airway and Oxygen Therapy: Patient Spontanous Breathing and Patient connected to nasal cannula oxygen  Post-op Pain: none  Post-op Assessment: Post-op Vital signs reviewed, Patient's Cardiovascular Status Stable, Respiratory Function Stable and Patent Airway              Post-op Vital Signs: Reviewed and stable  Last Vitals:  Filed Vitals:   10/01/14 1119  BP: 134/64  Pulse:   Temp: 36.6 C  Resp: 18    Complications: No apparent anesthesia complications

## 2014-10-01 NOTE — Anesthesia Procedure Notes (Addendum)
Procedure Name: MAC Date/Time: 10/01/2014 10:53 AM Performed by: Franco Nones Pre-anesthesia Checklist: Patient identified, Emergency Drugs available, Suction available, Timeout performed and Patient being monitored Patient Re-evaluated:Patient Re-evaluated prior to inductionOxygen Delivery Method: Non-rebreather mask

## 2014-10-01 NOTE — H&P (View-Only) (Signed)
Referring Provider: Altamease Oiler, FNP Primary Care Physician:  Altamease Oiler, FNP Primary GI:  Dr. Jena Gauss  Chief Complaint  Patient presents with  . Follow-up    HPI:   64 year old female presents for follow-up on endoscopy and colonoscopy. EGD with Maloney dilation performed 06/04/2014 for dysphagia, anorexia, weight loss, and NSAID abuse. This is done under deep sedation with findings including inflamed esophageal mucosa distal two thirds of friable mucosa and thin ribbonlike plaques circumferentially but no consistency with candidal esophagitis. Esophagus was narrowed but no focal stricture or neoplasm, no Barrett's identified, stomach empty to diffuse mottled erythema with one 5 mm prepyloric ulcer. Biopsies were taking and there is a brushing for KOH prep. Recommend increase Prilosec to 20 mg twice a day, avoid all NSAIDs, office visit in 3 months and repeat EGD in 3 months. She'll brushing found [place] aceto-inpatient which treated with Diflucan. Stomach biopsy with mild chronic inactive gastritis esophageal biopsy was superficial fragments of benign squamous mucosa. Colonoscopy completed on the same day as the same deep sedation for history of adenoma found clonic diverticulosis. Repeat colonoscopy in 5 years for surveillance.  Today she states she's doing better. Improved GERD symptoms as are dysphagia symptoms. Abdominal pain is much improved. States she's "still not exactly perfect and don't think I'll ever be, but I dont have the pain and problems I did." Improved belching and no more constant bloating. States she's still under a lot of stress. Denies N/V, hematochezia, melena. Denies chest pain, dyspnea, dizziness, lightheadedness, syncope, near syncope. Denies any other upper or lower GI symptoms.  Past Medical History  Diagnosis Date  . Generalized anxiety disorder   . Fatigue   . Weight loss   . Depression   . Arthritis   . PUD (peptic ulcer disease)   . Pneumonia     . Thyroid disorder     Past Surgical History  Procedure Laterality Date  . Thyroid surgery  1962    goiter removal  . Tubal ligation  1987  . Arm fracture repair  2009  . Colonoscopy  2011  . Esophagogastroduodenoscopy endoscopy  2011  . Esophagogastroduodenoscopy (egd) with propofol N/A 06/04/2014    [id number] esophagus s/p dilation  . Esophageal dilation N/A 06/04/2014    Procedure: ESOPHAGEAL DILATION 52 FRENCH MALONEY;  Surgeon: Corbin Ade, MD;  Location: [facility];  Service: Endoscopy;  Laterality: N/A;  . Colonoscopy with propofol N/A 06/04/2014    [id number] Diverticulosis  . Esophageal biopsy N/A 06/04/2014    Procedure: BIOPSY;  Surgeon: Corbin Ade, MD;  Location: [facility];  Service: Endoscopy;  Laterality: N/A;    Current Outpatient Prescriptions  Medication Sig Dispense Refill  . Aspirin-Salicylamide-Caffeine (BC HEADACHE POWDER PO) Take by mouth daily.    . fluticasone-salmeterol (ADVAIR HFA) 115-21 MCG/ACT inhaler Inhale 2 puffs into the lungs 2 (two) times daily.    Marland Kitchen HYDROcodone-acetaminophen (NORCO) 7.5-325 MG per tablet     . LORazepam (ATIVAN) 0.5 MG tablet     . omeprazole (PRILOSEC) 20 MG capsule Take 1 capsule (20 mg total) by mouth 2 (two) times daily before a meal. 60 capsule 3  . sertraline (ZOLOFT) 50 MG tablet     . fluconazole (DIFLUCAN) 100 MG tablet Take  on day 1 then take  daily for 21 days (Patient not taking: Reported on 09/03/2014) 23 tablet 0  . omeprazole (PRILOSEC) 20 MG capsule     . omeprazole (PRILOSEC) 20 MG capsule Take 1 capsule (20  mg total) by mouth 2 (two) times daily before a meal. (Patient not taking: Reported on 09/03/2014) 60 capsule 3  . polyethylene glycol-electrolytes (TRILYTE) 420 G solution Take 4,000 mLs by mouth as directed. (Patient not taking: Reported on 09/03/2014) 4000 mL 0   No current facility-administered medications for this visit.    Allergies as of 09/03/2014 - Review Complete 09/03/2014  Allergen  Reaction Noted  . Atorvastatin      Family History  Problem Relation Age of Onset  . Colon cancer Neg Hx   . Heart attack Mother     History   Social History  . Marital Status: Single    Spouse Name: N/A  . Number of Children: N/A  . Years of Education: N/A   Social History Main Topics  . Smoking status: Current Every Day Smoker -- 1.00 packs/day for 30 years    Types: Cigarettes  . Smokeless tobacco: Never Used  . Alcohol Use: No  . Drug Use: No  . Sexual Activity: Not on file   Other Topics Concern  . None   Social History Narrative    Review of Systems: General: Negative for anorexia, fever, chills, fatigue, weakness. ENT: Negative for hoarseness, difficulty swallowing. CV: Negative for chest pain, angina, palpitations, dyspnea on exertion, peripheral edema.  Respiratory: Negative for dyspnea at rest, dyspnea on exertion, cough, sputum, wheezing.  GI: See history of present illness. Endo: Negative for unusual weight change. Continued difficulty putting on weight; Thyropid monitored by PCP.  Heme: Negative for bruising or bleeding.   Physical Exam: BP 154/91 mmHg  Pulse 73  Temp(Src) 97.1 F (36.2 C) (Oral)  Ht  (1.702 m)  Wt 94 lb 9.6 oz (42.91 kg)  BMI 14.81 kg/m2 General:   Alert and oriented. Pleasant and cooperative. Well-nourished and well-developed. Thin. Head:  Normocephalic and atraumatic. Eyes:  Without icterus, sclera clear and conjunctiva pink.  Ears:  Normal auditory acuity. Cardiovascular:  S1, S2 present without murmurs appreciated. Normal pulses noted. Extremities without clubbing or edema. Respiratory:  Clear to auscultation bilaterally. No wheezes, rales, or rhonchi. No distress.  Gastrointestinal:  +BS, soft, non-tender and non-distended. No HSM noted. No guarding or rebound. No masses appreciated.  Rectal:  Deferred  Neurologic:  Alert and oriented x4;  grossly normal neurologically. Psych:  Alert and cooperative. Normal mood and  affect. Heme/Lymph/Immune: No excessive bruising noted.    09/03/2014 8:26 AM

## 2014-10-01 NOTE — Op Note (Signed)
[facility] [address] [place], [address]   ENDOSCOPY PROCEDURE REPORT  PATIENT: Betty, Brown  MR#: [id number] BIRTHDATE: 1951-01-06 , 63  yrs. old GENDER: female ENDOSCOPIST: R.  Roetta Sessions, MD FACP [facility] REFERRED BY:     Herbert Deaner, FNP PROCEDURE DATE:  26-Oct-2014 PROCEDURE:  EGD w/ biopsy INDICATIONS:  Follow-up gastric ulcer. MEDICATIONS: Deep sedation per Dr.  [facility] ASA CLASS:      Class III  CONSENT: The risks, benefits, limitations, alternatives and imponderables have been discussed.  The potential for biopsy, esophogeal dilation, etc. have also been reviewed.  Questions have been answered.  All parties agreeable.  Please see the history and physical in the medical record for more information.  DESCRIPTION OF PROCEDURE: After the risks benefits and alternatives of the procedure were thoroughly explained, informed consent was obtained.  The    endoscope was introduced through the mouth and advanced to the second portion of the duodenum , limited by Without limitations. The instrument was slowly withdrawn as the mucosa was fully examined. Estimated blood loss is zero unless otherwise noted in this procedure report.    Circumferential longitudinal "ribbonlike" plaques - distal one third of the esophagus.  Some "ringing" appearance of esophageal mucosa. The tubular esophagus was patent throughout its course.  Stomach empty.  2 cm hiatal hernia.  Scar in the antrum were previously noted gastric ulcer was located?"nowcompletely healed.  Patent pylorus.  Normal-appearing first and second portion of the duodenum.   Biopsies of abnormal distal esophagus taken for histologic study. The scope was then withdrawn from the patient and the procedure completed.  COMPLICATIONS: There were no immediate complications. EBL 3 mL ENDOSCOPIC IMPRESSION: Abnormal distal esophagus ?"biopsied. Hiatal hernia. Previously noted ulcer  completely healed  RECOMMENDATIONS: Follow-up on pathology. Further recommendations to follow.  REPEAT EXAM:  eSigned:  R. Roetta Sessions, MD Jerrel Ivory [facility] 2014/10/26 12:14 PM    CC:  CPT CODES: ICD CODES:  The ICD and CPT codes recommended by this software are interpretations from the data that the clinical staff has captured with the software.  The verification of the translation of this report to the ICD and CPT codes and modifiers is the sole responsibility of the health care institution and practicing physician where this report was generated.  [employer] will not be held responsible for the validity of the ICD and CPT codes included on this report.  AMA assumes no liability for data contained or not contained herein. CPT is a Publishing rights manager of the [employer].

## 2014-10-01 NOTE — Anesthesia Preprocedure Evaluation (Signed)
Anesthesia Evaluation  Patient identified by MRN, date of birth, ID band Patient awake    Reviewed: Allergy & Precautions, NPO status , Patient's Chart, lab work & pertinent test results  Airway Mallampati: I  TM Distance: >3 FB     Dental  (+) Edentulous Upper, Edentulous Lower   Pulmonary COPDCurrent Smoker,  breath sounds clear to auscultation        Cardiovascular Rhythm:Regular Rate:Normal     Neuro/Psych  Headaches, PSYCHIATRIC DISORDERS Anxiety Depression    GI/Hepatic PUD, GERD-  ,  Endo/Other  Hypothyroidism   Renal/GU      Musculoskeletal  (+) Arthritis -,   Abdominal   Peds  Hematology  (+) anemia ,   Anesthesia Other Findings   Reproductive/Obstetrics                             Anesthesia Physical Anesthesia Plan  ASA: III  Anesthesia Plan: MAC   Post-op Pain Management:    Induction: Intravenous  Airway Management Planned: Simple Face Mask  Additional Equipment:   Intra-op Plan:   Post-operative Plan:   Informed Consent: I have reviewed the patients History and Physical, chart, labs and discussed the procedure including the risks, benefits and alternatives for the proposed anesthesia with the patient or authorized representative who has indicated his/her understanding and acceptance.     Plan Discussed with:   Anesthesia Plan Comments:         Anesthesia Quick Evaluation

## 2014-10-01 NOTE — Transfer of Care (Signed)
Immediate Anesthesia Transfer of Care Note  Patient: Betty Brown  Procedure(s) Performed: Procedure(s) (LRB): ESOPHAGOGASTRODUODENOSCOPY (EGD) WITH PROPOFOL (N/A) BIOPSY (N/A)  Patient Location: PACU  Anesthesia Type: MAC  Level of Consciousness: awake  Airway & Oxygen Therapy: Patient Spontanous Breathing. Nasal cannula  Post-op Assessment: Report given to PACU RN, Post -op Vital signs reviewed and stable and Patient moving all extremities  Post vital signs: Reviewed and stable  Complications: No apparent anesthesia complications

## 2014-10-01 NOTE — Interval H&P Note (Signed)
History and Physical Interval Note:  10/01/2014 10:48 AM  Betty Brown  has presented today for surgery, with the diagnosis of GERD, gastric ulcer  The various methods of treatment have been discussed with the patient and family. After consideration of risks, benefits and other options for treatment, the patient has consented to  Procedure(s) with comments: ESOPHAGOGASTRODUODENOSCOPY (EGD) WITH PROPOFOL (N/A) - 915 as a surgical intervention .  The patient's history has been reviewed, patient examined, no change in status, stable for surgery.  I have reviewed the patient's chart and labs.  Questions were answered to the patient's satisfaction.     Betty Brown  No change. Patient denies dysphagia. Follow-up EGD given history of gastric ulcer. Will look esophagus again. Biopsies previously negative. Treated for Candida esophagitis. No dilation planned.The risks, benefits, limitations, alternatives and imponderables have been reviewed with the patient. Potential for esophageal dilation, biopsy, etc. have also been reviewed.  Questions have been answered. All parties agreeable.

## 2014-10-02 ENCOUNTER — Encounter (HOSPITAL_COMMUNITY): Payer: Self-pay | Admitting: Internal Medicine

## 2014-10-06 ENCOUNTER — Encounter: Payer: Self-pay | Admitting: Internal Medicine

## 2014-10-07 ENCOUNTER — Encounter: Payer: Self-pay | Admitting: Internal Medicine

## 2014-12-08 ENCOUNTER — Ambulatory Visit: Payer: Self-pay | Admitting: "Endocrinology

## 2015-03-23 ENCOUNTER — Telehealth: Payer: Self-pay | Admitting: Orthopaedic Surgery

## 2015-03-23 MED ORDER — HYDROCODONE-ACETAMINOPHEN 7.5-325 MG PO TABS: 1.0000 | ORAL_TABLET | ORAL | Status: DC | PRN

## 2015-03-23 NOTE — Telephone Encounter (Signed)
Patient called for refill on medication, Hydrocodone/Norco 7.5, quantity 120.  Her cell ph# is [phone].

## 2015-03-23 NOTE — Telephone Encounter (Signed)
Rx printed

## 2015-03-23 NOTE — Telephone Encounter (Signed)
Prescription available, called patient, busy signal only 

## 2015-04-06 ENCOUNTER — Telehealth: Payer: Self-pay | Admitting: Gastroenterology

## 2015-04-06 ENCOUNTER — Encounter: Payer: Self-pay | Admitting: "Endocrinology

## 2015-04-06 ENCOUNTER — Ambulatory Visit (INDEPENDENT_AMBULATORY_CARE_PROVIDER_SITE_OTHER): Payer: BC Managed Care – PPO | Admitting: "Endocrinology

## 2015-04-06 ENCOUNTER — Ambulatory Visit: Payer: Self-pay | Admitting: Gastroenterology

## 2015-04-06 ENCOUNTER — Encounter: Payer: Self-pay | Admitting: Gastroenterology

## 2015-04-06 VITALS — BP 147/84 | HR 96 | Ht 66.0 in | Wt 99.0 lb

## 2015-04-06 DIAGNOSIS — E079 Disorder of thyroid, unspecified: Secondary | ICD-10-CM | POA: Diagnosis not present

## 2015-04-06 LAB — T4, FREE: FREE T4: 0.9 ng/dL (ref 0.8–1.8)

## 2015-04-06 LAB — T3, FREE: T3, Free: 2.1 pg/mL — ABNORMAL LOW (ref 2.3–4.2)

## 2015-04-06 LAB — TSH: TSH: 3.31 m[IU]/L

## 2015-04-06 NOTE — Progress Notes (Signed)
Subjective:    Patient ID: Betty Brown, female    DOB: 06/15/1950, PCP Altamease Oiler, FNP   Past Medical History  Diagnosis Date  . Generalized anxiety disorder   . Fatigue   . Weight loss   . Depression   . Arthritis   . PUD (peptic ulcer disease)   . Pneumonia   . Thyroid disorder   . Candida esophagitis [facility])    Past Surgical History  Procedure Laterality Date  . Thyroid surgery  1962    goiter removal  . Tubal ligation  1987  . Arm fracture repair  2009  . Colonoscopy  2011  . Esophagogastroduodenoscopy endoscopy  2011  . Esophagogastroduodenoscopy (egd) with propofol N/A 06/04/2014    WUJ:[id number] esophagus s/p dilation  . Esophageal dilation N/A 06/04/2014    Procedure: ESOPHAGEAL DILATION 52 FRENCH MALONEY;  Surgeon: Corbin Ade, MD;  Location: [facility];  Service: Endoscopy;  Laterality: N/A;  . Colonoscopy with propofol N/A 06/04/2014    GNF:[id number] Diverticulosis  . Biopsy N/A 06/04/2014    Procedure: BIOPSY;  Surgeon: Corbin Ade, MD;  Location: [facility];  Service: Endoscopy;  Laterality: N/A;  . Esophagogastroduodenoscopy (egd) with propofol N/A 10/01/2014    RMR: abnormal distal esophagus biopsied. Hiatal hernia. Previously noted ulcer completely healed.   . Biopsy N/A 10/01/2014    Procedure: BIOPSY;  Surgeon: Corbin Ade, MD;  Location: [facility];  Service: Endoscopy;  Laterality: N/A;   Social History   Social History  . Marital Status: Single    Spouse Name: N/A  . Number of Children: N/A  . Years of Education: N/A   Social History Main Topics  . Smoking status: Current Every Day Smoker -- 1.00 packs/day for 30 years    Types: Cigarettes  . Smokeless tobacco: Never Used  . Alcohol Use: No  . Drug Use: No  . Sexual Activity: Not Asked   Other Topics Concern  . None   Social History Narrative   Outpatient Encounter Prescriptions as of 04/06/2015  Medication Sig  . acetaminophen (TYLENOL) 500 MG tablet Take 500 mg by mouth daily as needed  for mild pain.  . fluticasone-salmeterol (ADVAIR HFA) 115-21 MCG/ACT inhaler Inhale 2 puffs into the lungs 2 (two) times daily.  Marland Kitchen LORazepam (ATIVAN) 0.5 MG tablet Take 0.25-0.5 mg by mouth every 8 (eight) hours as needed for anxiety.   Marland Kitchen omeprazole (PRILOSEC) 20 MG capsule Take 1 capsule (20 mg total) by mouth 2 (two) times daily before a meal.  . Aspirin-Salicylamide-Caffeine (BC HEADACHE POWDER PO) Take 1 Package by mouth daily as needed (pain).   . Cyanocobalamin (VITAMIN B 12 PO) Take 1 tablet by mouth daily.  Marland Kitchen HYDROcodone-acetaminophen (NORCO) 7.5-325 MG tablet Take 1 tablet by mouth every 4 (four) hours as needed for moderate pain (Must last 30 days.  Do not drive or operate machinery while taking this medicine.).  Marland Kitchen Multiple Vitamin (MULTIVITAMIN WITH MINERALS) TABS tablet Take 1 tablet by mouth daily.  . Multiple Vitamins-Minerals (ZINC PO) Take 1 tablet by mouth daily.  . sertraline (ZOLOFT) 50 MG tablet Take 50 mg by mouth at bedtime.    No facility-administered encounter medications on file as of 04/06/2015.   ALLERGIES: Allergies  Allergen Reactions  . Atorvastatin Hives   VACCINATION STATUS:  There is no immunization history on file for this patient.  HPI 65 year old female patient with medical history as above she is being seen in consultation for abnormal thyroid function test  requested by her PCP Altamease Oiler, FNP.  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  Per her recollections, she never required thyroid hormone therapy. In 2011 she had elevated TSH of 8 but does not remember if she was treated for hypothyroidism. Most recently on 03/26/2014 she underwent lab work which showed TPO antibodies elevated at 9 thyroglobulin antibodies undetectable at less than 1. She did not have TSH, free T4 on her recent blood work.  She is concerned about her 25 pounds weight loss over the last 6-8 months associated  with palpitations, tremors, heat intolerance, hot flashes. She also has depression and anxiety on multiple medications. She is a chronic heavy smoker currently half pack a day trying to quit. She has family history of unidentified thyroid problem in her brother. She did not have any thyroid ultrasound since her surgery in 1965.  Review of Systems Constitutional: + weight loss, + fatigue, + subjective hyperthermia Eyes: no blurry vision, no xerophthalmia ENT: no sore throat, no nodules palpated in throat, no dysphagia/odynophagia, no hoarseness Cardiovascular: +palpitations, -leg swelling Respiratory: + cough/SOB Gastrointestinal: no N/V/D/C Musculoskeletal: no muscle/joint aches Skin: no rashes Neurological: + tremors Psychiatric: + depression/anxiety  Objective:    BP 147/84 mmHg  Pulse 96  Ht 5\' 6"  (1.676 m)  Wt 99 lb (44.906 kg)  BMI 15.99 kg/m2  SpO2 100%  Wt Readings from Last 3 Encounters:  04/06/15 99 lb (44.906 kg)  09/28/14 98 lb (44.453 kg)  09/03/14 94 lb 9.6 oz (42.91 kg)    Physical Exam Constitutional: underweight, , anxious , NAD Eyes: PERRLA, EOMI, no exophthalmos ENT: moist mucous membranes, healed thyroidectomy scar on anterior lower neck, no cervical lymphadenopathy Cardiovascular: RRR, No MRG Respiratory: CTA B Gastrointestinal: abdomen soft, NT, ND, BS+ Musculoskeletal: no deformities, strength intact in all 4 Skin: moist, warm, no rashes Neurological: fine  tremors with outstretched hands, DTR normal in all 4   CMP     Component Value Date/Time   NA 137 09/28/2014 1100   K 4.4 09/28/2014 1100   CL 103 09/28/2014 1100   CO2 26 09/28/2014 1100   GLUCOSE 57* 09/28/2014 1100   BUN 9 09/28/2014 1100   CREATININE 0.64 09/28/2014 1100   CREATININE 0.69 05/21/2014 1021   CALCIUM 9.1 09/28/2014 1100   PROT 6.9 05/21/2014 1021   ALBUMIN 4.3 05/21/2014 1021   AST 20 05/21/2014 1021   ALT 11 05/21/2014 1021   ALKPHOS 61 05/21/2014 1021   BILITOT 0.2  05/21/2014 1021   GFRNONAA >60 09/28/2014 1100   GFRAA >60 09/28/2014 1100   Labs from 03/26/2014 TPO 9 elevated, thyroglobulin antibodies less than 1.  -TSH from February 2011 was 8.   Assessment & Plan:   1. Disorder of thyroid gland -Patient with history of toxic goiter status post partial thyroidectomy in 1965 currently has multiple body symptoms including weight loss, palpitations, tremors, heat intolerance. Even though most of these symptoms are nonspecific and patient has anxiety/ depression on multiple medications, she has subtle indications of possible thyroid dysfunction.  I will proceed to complete her thyroid function test by doing TSH, free T4, free T3. -I will also proceed to obtain thyroid ultrasound to study her thyroid bed better. -She will return in 10 days with this studies for therapeutic decision if necessary.  2. chronic heavy smoker: I have extensively counseled her for smoking cessation. - I advised patient to maintain close follow up with Altamease Oiler, FNP  for primary care needs. Follow up plan: Return in about 10 days (around 04/16/2015) for Thyroid Ultrasound, labs today.  Marquis Lunch, MD Phone: [phone]  Fax: [phone]   04/06/2015, 11:02 AM

## 2015-04-06 NOTE — Telephone Encounter (Signed)
PATIENT WAS A NO SHOW AND LETTER SENT  °

## 2015-04-15 ENCOUNTER — Ambulatory Visit (HOSPITAL_COMMUNITY)
Admission: RE | Admit: 2015-04-15 | Discharge: 2015-04-15 | Disposition: A | Discharge status: Home / Self Care | Payer: BC Managed Care – PPO | Source: Ambulatory Visit | Attending: "Endocrinology | Admitting: "Endocrinology

## 2015-04-15 DIAGNOSIS — E079 Disorder of thyroid, unspecified: Secondary | ICD-10-CM | POA: Diagnosis present

## 2015-04-19 ENCOUNTER — Ambulatory Visit (INDEPENDENT_AMBULATORY_CARE_PROVIDER_SITE_OTHER): Payer: BC Managed Care – PPO | Admitting: "Endocrinology

## 2015-04-19 ENCOUNTER — Encounter: Payer: Self-pay | Admitting: "Endocrinology

## 2015-04-19 VITALS — BP 138/89 | HR 99 | Ht 66.0 in | Wt 98.0 lb

## 2015-04-19 DIAGNOSIS — E079 Disorder of thyroid, unspecified: Secondary | ICD-10-CM | POA: Diagnosis not present

## 2015-04-19 NOTE — Progress Notes (Signed)
Subjective:    Patient ID: Betty Brown, female    DOB: 08-Jul-1950, PCP Altamease Oiler, FNP   Past Medical History  Diagnosis Date  . Generalized anxiety disorder   . Fatigue   . Weight loss   . Depression   . Arthritis   . PUD (peptic ulcer disease)   . Pneumonia   . Thyroid disorder   . Candida esophagitis [facility])    Past Surgical History  Procedure Laterality Date  . Thyroid surgery  1962    goiter removal  . Tubal ligation  1987  . Arm fracture repair  2009  . Colonoscopy  2011  . Esophagogastroduodenoscopy endoscopy  2011  . Esophagogastroduodenoscopy (egd) with propofol N/A 06/04/2014    ZOX:[id number] esophagus s/p dilation  . Esophageal dilation N/A 06/04/2014    Procedure: ESOPHAGEAL DILATION 52 FR[address] MALONEY;  Surgeon: Corbin Ade, MD;  Location: [facility];  Service: Endoscopy;  Laterality: N/A;  . Colonoscopy with propofol N/A 06/04/2014    [id number]:[id number] Diverticulosis  . Biopsy N/A 06/04/2014    Procedure: BIOPSY;  Surgeon: Corbin Ade, MD;  Location: [facility];  Service: Endoscopy;  Laterality: N/A;  . Esophagogastroduodenoscopy (egd) with propofol N/A 10/01/2014    RMR: abnormal distal esophagus biopsied. Hiatal hernia. Previously noted ulcer completely healed.   . Biopsy N/A 10/01/2014    Procedure: BIOPSY;  Surgeon: Corbin Ade, MD;  Location: [facility];  Service: Endoscopy;  Laterality: N/A;   Social History   Social History  . Marital Status: Single    Spouse Name: N/A  . Number of Children: N/A  . Years of Education: N/A   Social History Main Topics  . Smoking status: Current Every Day Smoker -- 1.00 packs/day for 30 years    Types: Cigarettes  . Smokeless tobacco: Never Used  . Alcohol Use: No  . Drug Use: No  . Sexual Activity: Not Asked   Other Topics Concern  . None   Social History Narrative   Outpatient Encounter Prescriptions as of 04/19/2015  Medication Sig  . acetaminophen (TYLENOL) 500 MG tablet Take 500 mg by mouth daily as needed  for mild pain.  . Aspirin-Salicylamide-Caffeine (BC HEADACHE POWDER PO) Take 1 Package by mouth daily as needed (pain).   . Cyanocobalamin (VITAMIN B 12 PO) Take 1 tablet by mouth daily.  . fluticasone-salmeterol (ADVAIR HFA) 115-21 MCG/ACT inhaler Inhale 2 puffs into the lungs 2 (two) times daily.  Marland Kitchen HYDROcodone-acetaminophen (NORCO) 7.5-325 MG tablet Take 1 tablet by mouth every 4 (four) hours as needed for moderate pain (Must last 30 days.  Do not drive or operate machinery while taking this medicine.).  Marland Kitchen LORazepam (ATIVAN) 0.5 MG tablet Take 0.25-0.5 mg by mouth every 8 (eight) hours as needed for anxiety.   . Multiple Vitamin (MULTIVITAMIN WITH MINERALS) TABS tablet Take 1 tablet by mouth daily.  . Multiple Vitamins-Minerals (ZINC PO) Take 1 tablet by mouth daily.  Marland Kitchen omeprazole (PRILOSEC) 20 MG capsule Take 1 capsule (20 mg total) by mouth 2 (two) times daily before a meal.  . sertraline (ZOLOFT) 50 MG tablet Take 50 mg by mouth at bedtime.    No facility-administered encounter medications on file as of 04/19/2015.   ALLERGIES: Allergies  Allergen Reactions  . Atorvastatin Hives   VACCINATION STATUS:  There is no immunization history on file for this patient.  HPI 65 year old female patient with medical history as above. She is here to discuss her labs and thyroid ultrasound.  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  Per her recollections, she never required thyroid hormone therapy. In 2011 she had elevated TSH of 8 but does not remember if she was treated for hypothyroidism.  Most recently on 03/26/2014 she underwent lab work which showed TPO antibodies elevated at 9 thyroglobulin antibodies undetectable at less than 1.  Her TFTs are WNL.  She is concerned about her 25 pounds weight loss over the last 6-8 months associated with palpitations, tremors, heat intolerance, hot flashes. No change since her last  visit. She has has  depression and anxiety on multiple medications. She is a chronic heavy smoker currently half pack a day trying to quit. She has family history of unidentified thyroid problem in her brother. She did not have any thyroid ultrasound since her surgery in 1965.  Review of Systems Constitutional: + weight loss, + fatigue, + subjective hyperthermia Eyes: no blurry vision, no xerophthalmia ENT: no sore throat, no nodules palpated in throat, no dysphagia/odynophagia, no hoarseness Cardiovascular: +palpitations, -leg swelling Respiratory: + cough/SOB Gastrointestinal: no N/V/D/C Musculoskeletal: no muscle/joint aches Skin: no rashes Neurological: + tremors Psychiatric: + depression/anxiety  Objective:    BP 138/89 mmHg  Pulse 99  Ht 5\' 6"  (1.676 m)  Wt 98 lb (44.453 kg)  BMI 15.83 kg/m2  SpO2 97%  Wt Readings from Last 3 Encounters:  04/19/15 98 lb (44.453 kg)  04/06/15 99 lb (44.906 kg)  09/28/14 98 lb (44.453 kg)    Physical Exam Constitutional: underweight, , anxious , NAD Eyes: PERRLA, EOMI, no exophthalmos ENT: moist mucous membranes, healed thyroidectomy scar on anterior lower neck, no cervical lymphadenopathy Cardiovascular: RRR, No MRG Respiratory: CTA B Gastrointestinal: abdomen soft, NT, ND, BS+ Musculoskeletal: no deformities, strength intact in all 4 Skin: moist, warm, no rashes Neurological: fine  tremors with outstretched hands, DTR normal in all 4   CMP     Component Value Date/Time   NA 137 09/28/2014 1100   K 4.4 09/28/2014 1100   CL 103 09/28/2014 1100   CO2 26 09/28/2014 1100   GLUCOSE 57* 09/28/2014 1100   BUN 9 09/28/2014 1100   CREATININE 0.64 09/28/2014 1100   CREATININE 0.69 05/21/2014 1021   CALCIUM 9.1 09/28/2014 1100   PROT 6.9 05/21/2014 1021   ALBUMIN 4.3 05/21/2014 1021   AST 20 05/21/2014 1021   ALT 11 05/21/2014 1021   ALKPHOS 61 05/21/2014 1021   BILITOT 0.2 05/21/2014 1021   GFRNONAA >60 09/28/2014 1100   GFRAA >60  09/28/2014 1100    Recent Results (from the past 2160 hour(s))  T4, free     Status: None   Collection Time: 04/06/15 11:57 AM  Result Value Ref Range   Free T4 0.9 0.8 - 1.8 ng/dL  TSH     Status: None   Collection Time: 04/06/15 11:57 AM  Result Value Ref Range   TSH 3.31 mIU/L    Comment:   Reference Range   > or = 20 Years  0.40-4.50   Pregnancy Range First trimester  0.26-2.66 Second trimester 0.55-2.73 Third trimester  0.43-2.91   ** Please note change in unit of measure and reference range(s). **     T3, free     Status: Abnormal   Collection Time: 04/06/15 11:57 AM  Result Value Ref Range   T3, Free 2.1 (L) 2.3 - 4.2 pg/mL    Comment:   Footnotes:  (1) ** Please note change in reference  range(s). **      Labs from 03/26/2014 TPO 9 elevated, thyroglobulin antibodies less than 1.  -TSH from February 2011 was 8.   Assessment & Plan:   1. Disorder of thyroid gland -Patient with history of toxic goiter status post partial thyroidectomy in 1965 currently euthyroid. She has a background Hashimoto's with future risk of thyroid dysfunction.  Her thyroid ultrasound is only remarkable for shrunk lobes with no nodules.  -She  has multiple body symptoms unrelated to her thyroid. most of these symptoms are nonspecific and patient has anxiety/ depression on multiple medications, she does not have thyroid  dysfunction. She will not need thyroid intervention for now. She will have repeat TFTs in 3 months.  2. chronic heavy smoker: I have extensively counseled her for smoking cessation. - I advised patient to maintain close follow up with HARRIS,MEREDITH L, FNP for primary care needs. Follow up plan: Return in about 3 months (around 07/20/2015) for follow up with pre-visit labs.  Marquis Lunch, MD Phone: [phone]  Fax: [phone]   04/19/2015, 7:13 PM

## 2015-04-20 ENCOUNTER — Telehealth: Payer: Self-pay | Admitting: Orthopaedic Surgery

## 2015-04-20 NOTE — Telephone Encounter (Signed)
Patient called to request refill of medication (quantity 120, dosage every 4 hours prn for pain, as follows:   HYDROcodone-acetaminophen (NORCO) 7.5-325 MG tablet [[id number]][[id number]]      - She is aware that Dr Hilda LiasKeeling is out of office this week and that Dr Romeo AppleHarrison is taking care of prescription requests.  Patient's cell# is [phone]

## 2015-04-21 ENCOUNTER — Other Ambulatory Visit: Payer: Self-pay | Admitting: *Deleted

## 2015-04-21 MED ORDER — HYDROCODONE-ACETAMINOPHEN 7.5-325 MG PO TABS: 1.0000 | ORAL_TABLET | ORAL | Status: DC | PRN

## 2015-04-30 ENCOUNTER — Encounter

## 2015-05-06 ENCOUNTER — Inpatient Hospital Stay: Admit: 2015-05-06 | Payer: PRIVATE HEALTH INSURANCE | Primary: Nurse Practitioner

## 2015-05-12 ENCOUNTER — Ambulatory Visit: Payer: Self-pay | Admitting: Orthopaedic Surgery

## 2015-05-19 ENCOUNTER — Inpatient Hospital Stay: Admit: 2015-05-19 | Payer: PRIVATE HEALTH INSURANCE | Attending: Family Medicine | Primary: Nurse Practitioner

## 2015-05-19 DIAGNOSIS — Z1231 Encounter for screening mammogram for malignant neoplasm of breast: Secondary | ICD-10-CM

## 2015-05-20 ENCOUNTER — Encounter: Payer: Self-pay | Admitting: Orthopaedic Surgery

## 2015-05-20 ENCOUNTER — Ambulatory Visit (INDEPENDENT_AMBULATORY_CARE_PROVIDER_SITE_OTHER): Payer: BC Managed Care – PPO | Admitting: Orthopaedic Surgery

## 2015-05-20 VITALS — BP 135/87 | HR 89 | Temp 97.3°F | Ht 67.0 in | Wt 96.0 lb

## 2015-05-20 DIAGNOSIS — K219 Gastro-esophageal reflux disease without esophagitis: Secondary | ICD-10-CM | POA: Diagnosis not present

## 2015-05-20 DIAGNOSIS — M533 Sacrococcygeal disorders, not elsewhere classified: Secondary | ICD-10-CM | POA: Diagnosis not present

## 2015-05-20 MED ORDER — HYDROCODONE-ACETAMINOPHEN 7.5-325 MG PO TABS: 1.0000 | ORAL_TABLET | ORAL | Status: DC | PRN

## 2015-05-20 NOTE — Patient Instructions (Signed)

## 2015-05-20 NOTE — Progress Notes (Signed)
Patient UU:VOZD:Betty Brown Sosnowski, female DOB:12/21/1950, 65 y.o. GUY:[id number]  Chief Complaint  Patient presents with  . Follow-up    fractured coccyx     HPI  Betty Brown is a 65 y.o. female who has chronic coccyx pain.  She has no new injury.  She has no constipation.  She has chronic pain that has good and bad days.  She is taking her medicine, using her pillow at times.  HPI  Body mass index is 15.03 kg/(m^2).  Review of Systems  HENT: Negative for congestion.   Respiratory: Negative for cough and shortness of breath.   Cardiovascular: Negative for chest pain and leg swelling.  Endocrine: Positive for cold intolerance.  Musculoskeletal: Positive for arthralgias.  Allergic/Immunologic: Positive for environmental allergies.    Past Medical History  Diagnosis Date  . Generalized anxiety disorder   . Fatigue   . Weight loss   . Depression   . Arthritis   . PUD (peptic ulcer disease)   . Pneumonia   . Thyroid disorder   . Candida esophagitis [facility](H[facility])     Past Surgical History  Procedure Laterality Date  . Thyroid surgery  1962    goiter removal  . Tubal ligation  1987  . Arm fracture repair  2009  . Colonoscopy  2011  . Esophagogastroduodenoscopy endoscopy  2011  . Esophagogastroduodenoscopy (egd) with propofol N/A 06/04/2014    DGL:OVFIEPPIRMR:abnormal esophagus s/p dilation  . Esophageal dilation N/A 06/04/2014    Procedure: ESOPHAGEAL DILATION 52 FRENCH MALONEY;  Surgeon: Corbin Adeobert M Rourk, MD;  Location: [facility];  Service: Endoscopy;  Laterality: N/A;  . Colonoscopy with propofol N/A 06/04/2014    RJJ:[id number]:Colonic Diverticulosis  . Biopsy N/A 06/04/2014    Procedure: BIOPSY;  Surgeon: Corbin Adeobert M Rourk, MD;  Location: [facility];  Service: Endoscopy;  Laterality: N/A;  . Esophagogastroduodenoscopy (egd) with propofol N/A 10/01/2014    RMR: abnormal distal esophagus biopsied. Hiatal hernia. Previously noted ulcer completely healed.   . Biopsy N/A 10/01/2014    Procedure: BIOPSY;  Surgeon: Corbin Adeobert M  Rourk, MD;  Location: [facility];  Service: Endoscopy;  Laterality: N/A;    Family History  Problem Relation Age of Onset  . Colon cancer Neg Hx   . Heart attack Mother     Social History Social History  Substance Use Topics  . Smoking status: Current Every Day Smoker -- 1.00 packs/day for 30 years    Types: Cigarettes  . Smokeless tobacco: Never Used  . Alcohol Use: No    Allergies  Allergen Reactions  . Atorvastatin Hives    Current Outpatient Prescriptions  Medication Sig Dispense Refill  . acetaminophen (TYLENOL) 500 MG tablet Take 500 mg by mouth daily as needed for mild pain.    . Aspirin-Salicylamide-Caffeine (BC HEADACHE POWDER PO) Take 1 Package by mouth daily as needed (pain).     . Cyanocobalamin (VITAMIN B 12 PO) Take 1 tablet by mouth daily.    . fluticasone-salmeterol (ADVAIR HFA) 115-21 MCG/ACT inhaler Inhale 2 puffs into the lungs 2 (two) times daily.    Marland Kitchen. HYDROcodone-acetaminophen (NORCO) 7.5-325 MG tablet Take 1 tablet by mouth every 4 (four) hours as needed for moderate pain (Must last 30 days.  Do not drive or operate machinery while taking this medicine.). 120 tablet 0  . LORazepam (ATIVAN) 0.5 MG tablet Take 0.25-0.5 mg by mouth every 8 (eight) hours as needed for anxiety.     . Multiple Vitamin (MULTIVITAMIN WITH MINERALS) TABS tablet Take 1 tablet  by mouth daily.    . Multiple Vitamins-Minerals (ZINC PO) Take 1 tablet by mouth daily.    Marland Kitchen omeprazole (PRILOSEC) 20 MG capsule Take 1 capsule (20 mg total) by mouth 2 (two) times daily before a meal. 60 capsule 3   No current facility-administered medications for this visit.     Physical Exam  Blood pressure 135/87, pulse 89, temperature 97.3 F (36.3 C), height  (1.702 m), weight 96 lb (43.545 kg).  Constitutional: overall normal hygiene, normal nutrition, well developed, normal grooming, normal body habitus. Assistive device:none  Musculoskeletal: gait and station Limp none, muscle tone and  strength are normal, no tremors or atrophy is present.  .  Neurological: coordination overall normal.  Deep tendon reflex/nerve stretch intact.  Sensation normal.  Cranial nerves II-XII intact.   Skin:   normal overall no scars, lesions, ulcers or rashes. No psoriasis.  Psychiatric: Alert and oriented x 3.  Recent memory intact, remote memory unclear.  Normal mood and affect. Well groomed.  Good eye contact.  Cardiovascular: overall no swelling, no varicosities, no edema bilaterally, normal temperatures of the legs and arms, no clubbing, cyanosis and good capillary refill.  Lymphatic: palpation is normal.   Extremities:she has coccyx pain but no redness Inspection pain in coccyx area Strength and tone normal Range of motion not applicable.  The patient has been educated about the nature of the problem(s) and counseled on treatment options.  The patient appeared to understand what I have discussed and is in agreement with it.  Encounter Diagnoses  Name Primary?  . Coccyx pain Yes  . Gastroesophageal reflux disease, esophagitis presence not specified     PLAN Call if any problems.  Precautions discussed.  Continue current medications.   Return to clinic 3 months

## 2015-06-21 ENCOUNTER — Telehealth: Payer: Self-pay | Admitting: Orthopaedic Surgery

## 2015-06-21 MED ORDER — HYDROCODONE-ACETAMINOPHEN 5-325 MG PO TABS: 1.0000 | ORAL_TABLET | ORAL | Status: DC | PRN

## 2015-06-21 NOTE — Telephone Encounter (Signed)
Rx done. 

## 2015-06-21 NOTE — Telephone Encounter (Signed)
Patient called and requested a refill on Norco  7.5-325 mgs. Qty 120   Sig: Take 1 tablet by mouth every 4 (four) hours as needed for moderate pain (Must last 30 days.  Do not drive or operate machinery while taking this medicine °

## 2015-07-21 ENCOUNTER — Telehealth: Payer: Self-pay | Admitting: Orthopaedic Surgery

## 2015-07-21 MED ORDER — HYDROCODONE-ACETAMINOPHEN 5-325 MG PO TABS: 1.0000 | ORAL_TABLET | ORAL | Status: DC | PRN

## 2015-07-21 NOTE — Telephone Encounter (Signed)
Patient called and requested a refill on Hydrocodone-Acetaminophen(Norco) 5-325 mgs.  Qty 120  Sig: Take 1 tablet by mouth every 4 (four) hours as needed for moderate pain (Must last 30 days.  Do not take and drive a car or use machinery.). °

## 2015-07-21 NOTE — Telephone Encounter (Signed)
Rx done. 

## 2015-08-19 ENCOUNTER — Ambulatory Visit (INDEPENDENT_AMBULATORY_CARE_PROVIDER_SITE_OTHER): Payer: BC Managed Care – PPO | Admitting: Orthopaedic Surgery

## 2015-08-19 ENCOUNTER — Encounter: Payer: Self-pay | Admitting: Orthopaedic Surgery

## 2015-08-19 VITALS — BP 150/84 | HR 88 | Temp 98.1°F | Ht 64.0 in | Wt 97.8 lb

## 2015-08-19 DIAGNOSIS — K219 Gastro-esophageal reflux disease without esophagitis: Secondary | ICD-10-CM | POA: Diagnosis not present

## 2015-08-19 DIAGNOSIS — M533 Sacrococcygeal disorders, not elsewhere classified: Secondary | ICD-10-CM

## 2015-08-19 MED ORDER — HYDROCODONE-ACETAMINOPHEN 5-325 MG PO TABS: 1.0000 | ORAL_TABLET | ORAL | Status: DC | PRN

## 2015-08-19 NOTE — Progress Notes (Signed)
Patient ZO:XWRU:Betty Brown, female DOB:09/26/1950, 65 y.o. EAV:[id number]  Chief Complaint  Patient presents with  . Follow-up    coccyx pain    HPI  Betty Brown is a 65 y.o. female who has chronic coccyx pain.  She has no new injury, no bleeding.  She is taking her medicine and using her pillow.  HPI  Body mass index is 16.78 kg/(m^2).  ROS  Review of Systems  HENT: Negative for congestion.   Respiratory: Negative for cough and shortness of breath.   Cardiovascular: Negative for chest pain and leg swelling.  Endocrine: Positive for cold intolerance.  Musculoskeletal: Positive for arthralgias.  Allergic/Immunologic: Positive for environmental allergies.    Past Medical History  Diagnosis Date  . Generalized anxiety disorder   . Fatigue   . Weight loss   . Depression   . Arthritis   . PUD (peptic ulcer disease)   . Pneumonia   . Thyroid disorder   . Candida esophagitis [facility](HCC)     Past Surgical History  Procedure Laterality Date  . Thyroid surgery  1962    goiter removal  . Tubal ligation  1987  . Arm fracture repair  2009  . Colonoscopy  2011  . Esophagogastroduodenoscopy endoscopy  2011  . Esophagogastroduodenoscopy (egd) with propofol N/A 06/04/2014    NWG:NFAOZHYQRMR:abnormal esophagus s/p dilation  . Esophageal dilation N/A 06/04/2014    Procedure: ESOPHAGEAL DILATION 52 FR[address] MALONEY;  Surgeon: Corbin Adeobert M Rourk, MD;  Location: [facility];  Service: Endoscopy;  Laterality: N/A;  . Colonoscopy with propofol N/A 06/04/2014    MVH:QIONGEXRMR:Colonic Diverticulosis  . Biopsy N/A 06/04/2014    Procedure: BIOPSY;  Surgeon: Corbin Adeobert M Rourk, MD;  Location: [facility];  Service: Endoscopy;  Laterality: N/A;  . Esophagogastroduodenoscopy (egd) with propofol N/A 10/01/2014    RMR: abnormal distal esophagus biopsied. Hiatal hernia. Previously noted ulcer completely healed.   . Biopsy N/A 10/01/2014    Procedure: BIOPSY;  Surgeon: Corbin Adeobert M Rourk, MD;  Location: [facility];  Service: Endoscopy;  Laterality: N/A;     Family History  Problem Relation Age of Onset  . Colon cancer Neg Hx   . Heart attack Mother     Social History Social History  Substance Use Topics  . Smoking status: Current Every Day Smoker -- 1.00 packs/day for 30 years    Types: Cigarettes  . Smokeless tobacco: Never Used  . Alcohol Use: No    Allergies  Allergen Reactions  . Atorvastatin Hives    Current Outpatient Prescriptions  Medication Sig Dispense Refill  . acetaminophen (TYLENOL) 500 MG tablet Take 500 mg by mouth daily as needed for mild pain.    . Aspirin-Salicylamide-Caffeine (BC HEADACHE POWDER PO) Take 1 Package by mouth daily as needed (pain).     . Cyanocobalamin (VITAMIN B 12 PO) Take 1 tablet by mouth daily.    . fluticasone-salmeterol (ADVAIR HFA) 115-21 MCG/ACT inhaler Inhale 2 puffs into the lungs 2 (two) times daily.    Marland Kitchen. HYDROcodone-acetaminophen (NORCO/VICODIN) 5-325 MG tablet Take 1 tablet by mouth every 4 (four) hours as needed for moderate pain (Must last 30 days.  Do not take and drive a car or use machinery.). 120 tablet 0  . LORazepam (ATIVAN) 0.5 MG tablet Take 0.25-0.5 mg by mouth every 8 (eight) hours as needed for anxiety.     . Multiple Vitamin (MULTIVITAMIN WITH MINERALS) TABS tablet Take 1 tablet by mouth daily.    . Multiple Vitamins-Minerals (ZINC PO) Take 1 tablet  by mouth daily.    . naproxen (NAPROSYN) 500 MG tablet Take 500 mg by mouth 2 (two) times daily with a meal.    . omeprazole (PRILOSEC) 20 MG capsule Take 1 capsule (20 mg total) by mouth 2 (two) times daily before a meal. 60 capsule 3   No current facility-administered medications for this visit.     Physical Exam  Blood pressure 150/84, pulse 88, temperature 98.1 F (36.7 C), height  (1.626 m), weight 97 lb 12.8 oz (44.362 kg).  Constitutional: overall normal hygiene, normal nutrition, well developed, normal grooming, normal body habitus. Assistive device:none  Musculoskeletal: gait and station Limp none,  muscle tone and strength are normal, no tremors or atrophy is present.  .  Neurological: coordination overall normal.  Deep tendon reflex/nerve stretch intact.  Sensation normal.  Cranial nerves II-XII intact.   Skin:   normal overall no scars, lesions, ulcers or rashes. No psoriasis.  Psychiatric: Alert and oriented x 3.  Recent memory intact, remote memory unclear.  Normal mood and affect. Well groomed.  Good eye contact.  Cardiovascular: overall no swelling, no varicosities, no edema bilaterally, normal temperatures of the legs and arms, no clubbing, cyanosis and good capillary refill.  Lymphatic: palpation is normal.  Coccyx is tender.  ROM of back is full.  NV intact.  The patient has been educated about the nature of the problem(s) and counseled on treatment options.  The patient appeared to understand what I have discussed and is in agreement with it.  Encounter Diagnoses  Name Primary?  . Coccyx pain Yes  . Gastroesophageal reflux disease, esophagitis presence not specified     PLAN Call if any problems.  Precautions discussed.  Continue current medications.   Return to clinic 3 months   Electronically Signed Darreld Mclean, MD 7/13/20172:19 PM

## 2015-09-20 ENCOUNTER — Telehealth: Payer: Self-pay | Admitting: Orthopaedic Surgery

## 2015-09-20 NOTE — Telephone Encounter (Signed)
Patient called for refill:  HYDROcodone-acetaminophen (NORCO/VICODIN) 5-325 MG tablet 120 tablet 0 08/19/2015

## 2015-09-21 MED ORDER — HYDROCODONE-ACETAMINOPHEN 5-325 MG PO TABS: 1.0000 | ORAL_TABLET | ORAL | 0 refills | Status: DC | PRN

## 2015-10-21 ENCOUNTER — Telehealth: Payer: Self-pay | Admitting: Orthopaedic Surgery

## 2015-10-21 MED ORDER — HYDROCODONE-ACETAMINOPHEN 5-325 MG PO TABS
1.0000 | ORAL_TABLET | Freq: Four times a day (QID) | ORAL | 0 refills | Status: DC | PRN
Start: 2015-10-21 — End: 2015-11-18

## 2015-10-21 NOTE — Telephone Encounter (Signed)
Patient called for refill UE:[id number]:HYDROcodone-acetaminophen (NORCO/VICODIN) 5-325 MG tablet - patient's insurance is [employer], [employer].

## 2015-11-18 ENCOUNTER — Encounter: Payer: Self-pay | Admitting: Orthopaedic Surgery

## 2015-11-18 ENCOUNTER — Ambulatory Visit (INDEPENDENT_AMBULATORY_CARE_PROVIDER_SITE_OTHER): Payer: BC Managed Care – PPO | Admitting: Orthopaedic Surgery

## 2015-11-18 VITALS — BP 145/86 | HR 99 | Temp 97.7°F | Ht 65.0 in | Wt 100.0 lb

## 2015-11-18 DIAGNOSIS — M533 Sacrococcygeal disorders, not elsewhere classified: Secondary | ICD-10-CM | POA: Diagnosis not present

## 2015-11-18 DIAGNOSIS — K219 Gastro-esophageal reflux disease without esophagitis: Secondary | ICD-10-CM | POA: Diagnosis not present

## 2015-11-18 MED ORDER — NAPROXEN 500 MG PO TABS: 500.0000 mg | ORAL_TABLET | Freq: Two times a day (BID) | ORAL | 5 refills | Status: AC

## 2015-11-18 MED ORDER — HYDROCODONE-ACETAMINOPHEN 5-325 MG PO TABS: 1.0000 | ORAL_TABLET | Freq: Four times a day (QID) | ORAL | 0 refills | Status: AC | PRN

## 2015-11-18 NOTE — Progress Notes (Signed)
Patient ZO:XWRU:Betty Brown, female DOB:03/18/1950, 65 y.o. EAV:[id number]  Chief Complaint  Patient presents with  . Follow-up    coccyx pain    HPI  Betty Brown is a 65 y.o. female who has chronic but stable coccyx pain.  She has no new trauma, no problem.  She has no constipation.  She uses a pillow at times. HPI  Body mass index is 16.64 kg/m.  ROS  Review of Systems  HENT: Negative for congestion.   Respiratory: Negative for cough and shortness of breath.   Cardiovascular: Negative for chest pain and leg swelling.  Endocrine: Positive for cold intolerance.  Musculoskeletal: Positive for arthralgias.  Allergic/Immunologic: Positive for environmental allergies.    Past Medical History:  Diagnosis Date  . Arthritis   . Candida esophagitis (HCC)   . Depression   . Fatigue   . Generalized anxiety disorder   . Pneumonia   . PUD (peptic ulcer disease)   . Thyroid disorder   . Weight loss     Past Surgical History:  Procedure Laterality Date  . arm fracture repair  2009  . BIOPSY N/A 06/04/2014   Procedure: BIOPSY;  Surgeon: Corbin Adeobert M Rourk, MD;  Location: [facility];  Service: Endoscopy;  Laterality: N/A;  . BIOPSY N/A 10/01/2014   Procedure: BIOPSY;  Surgeon: Corbin Adeobert M Rourk, MD;  Location: [facility];  Service: Endoscopy;  Laterality: N/A;  . COLONOSCOPY  2011  . COLONOSCOPY WITH PROPOFOL N/A 06/04/2014   NWG:NFAOZHYRMR:Colonic Diverticulosis  . ESOPHAGEAL DILATION N/A 06/04/2014   Procedure: ESOPHAGEAL DILATION 52 FR[address] MALONEY;  Surgeon: Corbin Adeobert M Rourk, MD;  Location: [facility];  Service: Endoscopy;  Laterality: N/A;  . ESOPHAGOGASTRODUODENOSCOPY (EGD) WITH PROPOFOL N/A 06/04/2014   QMV:HQIONGEXRMR:abnormal esophagus s/p dilation  . ESOPHAGOGASTRODUODENOSCOPY (EGD) WITH PROPOFOL N/A 10/01/2014   RMR: abnormal distal esophagus biopsied. Hiatal hernia. Previously noted ulcer completely healed.   . ESOPHAGOGASTRODUODENOSCOPY ENDOSCOPY  2011  . THYROID SURGERY  1962   goiter removal  . TUBAL LIGATION   1987    Family History  Problem Relation Age of Onset  . Colon cancer Neg Hx   . Heart attack Mother     Social History Social History  Substance Use Topics  . Smoking status: Current Every Day Smoker    Packs/day: 1.00    Years: 30.00    Types: Cigarettes  . Smokeless tobacco: Never Used  . Alcohol use No    Allergies  Allergen Reactions  . Atorvastatin Hives    Current Outpatient Prescriptions  Medication Sig Dispense Refill  . acetaminophen (TYLENOL) 500 MG tablet Take 500 mg by mouth daily as needed for mild pain.    . Aspirin-Salicylamide-Caffeine (BC HEADACHE POWDER PO) Take 1 Package by mouth daily as needed (pain).     . Cyanocobalamin (VITAMIN B 12 PO) Take 1 tablet by mouth daily.    . fluticasone-salmeterol (ADVAIR HFA) 115-21 MCG/ACT inhaler Inhale 2 puffs into the lungs 2 (two) times daily.    Marland Kitchen. HYDROcodone-acetaminophen (NORCO/VICODIN) 5-325 MG tablet Take 1 tablet by mouth every 6 (six) hours as needed for moderate pain (Must last 30 days.Do not take and drive a car or use machinery.). 100 tablet 0  . LORazepam (ATIVAN) 0.5 MG tablet Take 0.25-0.5 mg by mouth every 8 (eight) hours as needed for anxiety.     . Multiple Vitamin (MULTIVITAMIN WITH MINERALS) TABS tablet Take 1 tablet by mouth daily.    . Multiple Vitamins-Minerals (ZINC PO) Take 1 tablet by mouth  daily.    . naproxen (NAPROSYN) 500 MG tablet Take 1 tablet (500 mg total) by mouth 2 (two) times daily with a meal. 60 tablet 5  . omeprazole (PRILOSEC) 20 MG capsule Take 1 capsule (20 mg total) by mouth 2 (two) times daily before a meal. 60 capsule 3   No current facility-administered medications for this visit.      Physical Exam  Blood pressure (!) 145/86, pulse 99, temperature 97.7 F (36.5 C), height 5\' 5"  (1.651 m), weight 100 lb (45.4 kg).  Constitutional: overall normal hygiene, normal nutrition, well developed, normal grooming, normal body habitus. Assistive  device:none  Musculoskeletal: gait and station Limp none, muscle tone and strength are normal, no tremors or atrophy is present.  .  Neurological: coordination overall normal.  Deep tendon reflex/nerve stretch intact.  Sensation normal.  Cranial nerves II-XII intact.   Skin:   Normal overall no scars, lesions, ulcers or rashes. No psoriasis.  Psychiatric: Alert and oriented x 3.  Recent memory intact, remote memory unclear.  Normal mood and affect. Well groomed.  Good eye contact.  Cardiovascular: overall no swelling, no varicosities, no edema bilaterally, normal temperatures of the legs and arms, no clubbing, cyanosis and good capillary refill.  Lymphatic: palpation is normal.  She has coccyx pain.  NV intact.  The patient has been educated about the nature of the problem(s) and counseled on treatment options.  The patient appeared to understand what I have discussed and is in agreement with it.  Encounter Diagnoses  Name Primary?  . Coccyx pain Yes  . Gastroesophageal reflux disease, esophagitis presence not specified     PLAN Call if any problems.  Precautions discussed.  Continue current medications.   Return to clinic 3 months   Electronically Signed Darreld Mclean, MD 10/12/20172:08 PM

## 2015-12-08 DEATH — deceased

## 2016-02-17 ENCOUNTER — Ambulatory Visit: Payer: Self-pay | Admitting: Orthopaedic Surgery

## 2016-03-16 ENCOUNTER — Telehealth: Payer: Self-pay | Admitting: Internal Medicine

## 2016-03-16 NOTE — Telephone Encounter (Signed)
Patient's sister is going to send me a copy of her sister's death certificate and administrator paperwork in the mail.

## 2016-03-16 NOTE — Telephone Encounter (Signed)
I spoke with the patient's sister(Letty Orvan Falconerampbell) and she stated [employer](collection agency) needed a death certificate and paperwork showing she was the administrator of her sister's estate.

## 2016-03-16 NOTE — Telephone Encounter (Signed)
[phone], PLEASE CALL PT SISTER, PATIENT IS DECEASED AND HAS GOTTEN A BILL IN THE MAIL.  WAS TOLD BY THE COLLECTION AGENCY THAT WE MAY NEED A DEATH CERTIFICATE.  I TOLD PATIENT I WOULD HAVE OUR OFFICE MANAGER CALL HER BACK AND WOULD BE ABLE TO DIRECT HER

## 2016-03-16 NOTE — Telephone Encounter (Signed)
Lmom

## 2016-06-05 ENCOUNTER — Encounter

## 2016-06-08 ENCOUNTER — Inpatient Hospital Stay: Admit: 2016-06-08 | Payer: MEDICARE | Primary: Nurse Practitioner

## 2016-06-08 ENCOUNTER — Inpatient Hospital Stay: Admit: 2016-06-08 | Payer: MEDICARE | Attending: Family Medicine | Primary: Nurse Practitioner

## 2016-06-08 ENCOUNTER — Encounter

## 2016-06-08 DIAGNOSIS — Z1231 Encounter for screening mammogram for malignant neoplasm of breast: Secondary | ICD-10-CM

## 2016-06-08 DIAGNOSIS — Z1211 Encounter for screening for malignant neoplasm of colon: Secondary | ICD-10-CM

## 2016-06-08 LAB — CBC WITH AUTOMATED DIFF
ABS. BASOPHILS: 0 10*3/uL (ref 0.0–0.06)
ABS. EOSINOPHILS: 0.1 10*3/uL (ref 0.0–0.4)
ABS. LYMPHOCYTES: 2 10*3/uL (ref 0.9–3.6)
ABS. MONOCYTES: 0.3 10*3/uL (ref 0.05–1.2)
ABS. NEUTROPHILS: 2 10*3/uL (ref 1.8–8.0)
BASOPHILS: 0 % (ref 0–2)
EOSINOPHILS: 2 % (ref 0–5)
HCT: 32.4 % — ABNORMAL LOW (ref 35.0–45.0)
HGB: 10.8 g/dL — ABNORMAL LOW (ref 12.0–16.0)
LYMPHOCYTES: 45 % (ref 21–52)
MCH: 26.5 PG (ref 24.0–34.0)
MCHC: 33.3 g/dL (ref 31.0–37.0)
MCV: 79.4 FL (ref 74.0–97.0)
MONOCYTES: 7 % (ref 3–10)
MPV: 10.2 FL (ref 9.2–11.8)
NEUTROPHILS: 46 % (ref 40–73)
PLATELET: 284 10*3/uL (ref 135–420)
RBC: 4.08 M/uL — ABNORMAL LOW (ref 4.20–5.30)
RDW: 14.3 % (ref 11.6–14.5)
WBC: 4.5 10*3/uL — ABNORMAL LOW (ref 4.6–13.2)

## 2016-06-08 LAB — METABOLIC PANEL, COMPREHENSIVE
A-G Ratio: 1 (ref 0.8–1.7)
ALT (SGPT): 22 U/L (ref 13–56)
AST (SGOT): 22 U/L (ref 15–37)
Albumin: 3.6 g/dL (ref 3.4–5.0)
Alk. phosphatase: 107 U/L (ref 45–117)
Anion gap: 4 mmol/L (ref 3.0–18)
BUN/Creatinine ratio: 20 (ref 12–20)
BUN: 17 MG/DL (ref 7.0–18)
Bilirubin, total: 0.4 MG/DL (ref 0.2–1.0)
CO2: 31 mmol/L (ref 21–32)
Calcium: 8.5 MG/DL (ref 8.5–10.1)
Chloride: 104 mmol/L (ref 100–108)
Creatinine: 0.85 MG/DL (ref 0.6–1.3)
GFR est AA: >60 mL/min/{1.73_m2} (ref >60)
GFR est non-AA: >60 mL/min/{1.73_m2} (ref >60)
Globulin: 3.6 g/dL (ref 2.0–4.0)
Glucose: 91 mg/dL (ref 74–99)
Potassium: 3.9 mmol/L (ref 3.5–5.5)
Protein, total: 7.2 g/dL (ref 6.4–8.2)
Sodium: 139 mmol/L (ref 136–145)

## 2016-06-08 LAB — LIPID PANEL
CHOL/HDL Ratio: 3.2 (ref 0–5.0)
Cholesterol, total: 206 MG/DL — ABNORMAL HIGH (ref <200)
HDL Cholesterol: 64 MG/DL — ABNORMAL HIGH (ref 40–60)
LDL, calculated: 128 MG/DL — ABNORMAL HIGH (ref 0–100)
Triglyceride: 70 MG/DL (ref <150)
VLDL, calculated: 14 MG/DL

## 2017-02-18 ENCOUNTER — Inpatient Hospital Stay
Admit: 2017-02-18 | Discharge: 2017-02-18 | Disposition: A | Discharge status: Home / Self Care | Payer: MEDICARE | Attending: Emergency Medicine

## 2017-02-18 ENCOUNTER — Emergency Department: Admit: 2017-02-18 | Payer: MEDICARE | Primary: Nurse Practitioner

## 2017-02-18 DIAGNOSIS — R51 Headache: Secondary | ICD-10-CM

## 2017-02-18 LAB — EKG 12-LEAD
Atrial Rate: 65 {beats}/min
P Axis: 63 degrees
P-R Interval: 204 ms
Q-T Interval: 422 ms
QRS Duration: 86 ms
QTc Calculation (Bazett): 438 ms
R Axis: 29 degrees
T Axis: 20 degrees
Ventricular Rate: 65 {beats}/min

## 2017-02-18 LAB — CBC WITH AUTOMATED DIFF
ABS. BASOPHILS: 0 10*3/uL (ref 0.0–0.1)
ABS. EOSINOPHILS: 0.1 10*3/uL (ref 0.0–0.4)
ABS. LYMPHOCYTES: 2.3 10*3/uL (ref 0.9–3.6)
ABS. MONOCYTES: 0.2 10*3/uL (ref 0.05–1.2)
ABS. NEUTROPHILS: 2.1 10*3/uL (ref 1.8–8.0)
BASOPHILS: 0 % (ref 0–2)
EOSINOPHILS: 2 % (ref 0–5)
HCT: 33.1 % — ABNORMAL LOW (ref 35.0–45.0)
HGB: 10.8 g/dL — ABNORMAL LOW (ref 12.0–16.0)
LYMPHOCYTES: 49 % (ref 21–52)
MCH: 26.3 PG (ref 24.0–34.0)
MCHC: 32.6 g/dL (ref 31.0–37.0)
MCV: 80.5 FL (ref 74.0–97.0)
MONOCYTES: 5 % (ref 3–10)
MPV: 9.4 FL (ref 9.2–11.8)
NEUTROPHILS: 44 % (ref 40–73)
PLATELET: 325 10*3/uL (ref 135–420)
RBC: 4.11 M/uL — ABNORMAL LOW (ref 4.20–5.30)
RDW: 14.4 % (ref 11.6–14.5)
WBC: 4.7 10*3/uL (ref 4.6–13.2)

## 2017-02-18 LAB — GLUCOSE, POC: Glucose (POC): 107 mg/dL (ref 70–110)

## 2017-02-18 LAB — PROTHROMBIN TIME + INR
INR: 1 (ref 0.8–1.2)
Prothrombin time: 12.4 s (ref 11.5–15.2)

## 2017-02-18 LAB — METABOLIC PANEL, BASIC
Anion gap: 7 mmol/L (ref 3.0–18)
BUN/Creatinine ratio: 18 (ref 12–20)
BUN: 17 MG/DL (ref 7.0–18)
CO2: 29 mmol/L (ref 21–32)
Calcium: 8.6 MG/DL (ref 8.5–10.1)
Chloride: 104 mmol/L (ref 100–108)
Creatinine: 0.94 MG/DL (ref 0.6–1.3)
GFR est AA: >60 mL/min/{1.73_m2} (ref >60)
GFR est non-AA: 60 mL/min/{1.73_m2} — ABNORMAL LOW (ref >60)
Glucose: 111 mg/dL — ABNORMAL HIGH (ref 74–99)
Potassium: 3.3 mmol/L — ABNORMAL LOW (ref 3.5–5.5)
Sodium: 140 mmol/L (ref 136–145)

## 2017-02-18 LAB — EKG, 12 LEAD, INITIAL
Atrial Rate: 65 {beats}/min
Calculated P Axis: 63 degrees
Calculated R Axis: 29 degrees
Calculated T Axis: 20 degrees
P-R Interval: 204 ms
Q-T Interval: 422 ms
QRS Duration: 86 ms
QTC Calculation (Bezet): 438 ms
Ventricular Rate: 65 {beats}/min

## 2017-02-18 MED ORDER — MECLIZINE 25 MG TAB
25 mg | ORAL_TABLET | ORAL | 0 refills | Status: AC
Start: 2017-02-18 — End: ?

## 2017-02-18 NOTE — ED Provider Notes (Signed)
EMERGENCY DEPARTMENT HISTORY AND PHYSICAL EXAM    1:05 PM      Date: 02/18/2017  Patient Name: Laurie Mercado    History of Presenting Illness     Chief Complaint   Patient presents with   ??? Headache         History Provided By: Patient     Chief Complaint: Headache  Duration:  This morning  Timing:  Intermittent  Location: Not obtained at this time   Quality: Not obtained at this time   Severity: Moderate  Modifying Factors: Not obtained at this time   Associated Symptoms: Dizziness, nausea, and visual disturbance      Additional History (Context): 1:09 PM Laurie Mercado is a 67 y.o. female with h/o HTN, ovarian tumor, and mental retardation who presents to ED complaining of moderate intermittent headache with associated symptoms of dizziness, nausea, and visual disturbance onset this morning. The pt stated that her headaches and associated symptoms last 15 minutes. Admits to taking Asprin occasionally. Denied any speech difficulty.     PCP: Marissa Nestle., MD        Current Outpatient Medications   Medication Sig Dispense Refill   ??? meclizine (ANTIVERT) 25 mg tablet Take 1 tablet by mouth every 6 hours for the next 3 days.  Then stop taking the meclizine.  Restart the meclizine for 3 day intervals if vertigo/ dizziness returns. 20 Tab 0   ??? lisinopril-hydrochlorothiazide (PRINZIDE, ZESTORETIC) 10-12.5 mg per tablet Take 1 Tab by mouth daily.    Indications: HYPERTENSION         Past History     Past Medical History:  Past Medical History:   Diagnosis Date   ??? Hypercholesterolemia    ??? Hypertension    ??? Iron deficiency anemias    ??? Ovarian tumor (benign)    ??? Retardation mental        Past Surgical History:  Past Surgical History:   Procedure Laterality Date   ??? HX HEMORRHOIDECTOMY     ??? HX HYSTERECTOMY  01/05/1992    fibroids   ??? HX OOPHORECTOMY  1990   ??? HX OVARIAN CYST REMOVAL  2011     ovarian tumor removed       Family History:  Family History   Problem Relation Age of Onset   ??? Hypertension Brother     ??? Cancer Brother    ??? Hypertension Mother    ??? Hypertension Father    ??? Hypertension Sister        Social History:  Social History     Tobacco Use   ??? Smoking status: Never Smoker   Substance Use Topics   ??? Alcohol use: No   ??? Drug use: No       Allergies:  No Known Allergies      Review of Systems     Review of Systems   Constitutional: Negative for diaphoresis and fever.   HENT: Negative for congestion and sore throat.    Eyes: Positive for visual disturbance. Negative for pain and itching.   Respiratory: Negative for cough and shortness of breath.    Cardiovascular: Negative for chest pain and palpitations.   Gastrointestinal: Positive for nausea. Negative for abdominal pain and diarrhea.   Endocrine: Negative for polydipsia and polyuria.   Genitourinary: Negative for dysuria and hematuria.   Musculoskeletal: Negative for arthralgias and myalgias.   Skin: Negative for rash and wound.   Neurological: Positive for dizziness and headaches. Negative for seizures,  syncope and speech difficulty.   Hematological: Does not bruise/bleed easily.   Psychiatric/Behavioral: Negative for agitation and hallucinations.   All other systems reviewed and are negative.        Physical Exam     Patient Vitals for the past 12 hrs:   Temp Pulse Resp BP SpO2   02/18/17 1015 ??? (!) 58 16 127/61 99 %   02/18/17 1010 ??? ??? ??? ??? 99 %   02/18/17 1006 98.3 ??F (36.8 ??C) 71 22 134/64 99 %   02/18/17 0914 98.2 ??F (36.8 ??C) 77 16 125/71 100 %         Physical Exam   Constitutional: She appears well-developed and well-nourished.   HENT:   Head: Normocephalic and atraumatic.   Negative HENTs exam   Eyes: Conjunctivae are normal. No scleral icterus.   Neck: Normal range of motion. Neck supple. No JVD present.   Cardiovascular: Normal rate, regular rhythm and normal heart sounds.   4 intact extremity pulses   Pulmonary/Chest: Effort normal and breath sounds normal.   Abdominal: Soft. She exhibits no mass. There is no tenderness.    Musculoskeletal: Normal range of motion.   Lymphadenopathy:     She has no cervical adenopathy.   Neurological: She is alert.   Neuro intact   Skin: Skin is warm and dry.   Nursing note and vitals reviewed.  Neuro, strength and sensation intact, cn intact, normal HINTS, no coordination deficit.      Diagnostic Study Results   Labs -  Recent Results (from the past 12 hour(s))   METABOLIC PANEL, BASIC    Collection Time: 02/18/17  9:40 AM   Result Value Ref Range    Sodium 140 136 - 145 mmol/L    Potassium 3.3 (L) 3.5 - 5.5 mmol/L    Chloride 104 100 - 108 mmol/L    CO2 29 21 - 32 mmol/L    Anion gap 7 3.0 - 18 mmol/L    Glucose 111 (H) 74 - 99 mg/dL    BUN 17 7.0 - 18 MG/DL    Creatinine 1.61 0.6 - 1.3 MG/DL    BUN/Creatinine ratio 18 12 - 20      GFR est AA >60 >60 ml/min/1.33m2    GFR est non-AA 60 (L) >60 ml/min/1.70m2    Calcium 8.6 8.5 - 10.1 MG/DL   CBC WITH AUTOMATED DIFF    Collection Time: 02/18/17  9:40 AM   Result Value Ref Range    WBC 4.7 4.6 - 13.2 K/uL    RBC 4.11 (L) 4.20 - 5.30 M/uL    HGB 10.8 (L) 12.0 - 16.0 g/dL    HCT 09.6 (L) 04.5 - 45.0 %    MCV 80.5 74.0 - 97.0 FL    MCH 26.3 24.0 - 34.0 PG    MCHC 32.6 31.0 - 37.0 g/dL    RDW 40.9 81.1 - 91.4 %    PLATELET 325 135 - 420 K/uL    MPV 9.4 9.2 - 11.8 FL    NEUTROPHILS 44 40 - 73 %    LYMPHOCYTES 49 21 - 52 %    MONOCYTES 5 3 - 10 %    EOSINOPHILS 2 0 - 5 %    BASOPHILS 0 0 - 2 %    ABS. NEUTROPHILS 2.1 1.8 - 8.0 K/UL    ABS. LYMPHOCYTES 2.3 0.9 - 3.6 K/UL    ABS. MONOCYTES 0.2 0.05 - 1.2 K/UL    ABS. EOSINOPHILS 0.1  0.0 - 0.4 K/UL    ABS. BASOPHILS 0.0 0.0 - 0.1 K/UL    DF AUTOMATED     PROTHROMBIN TIME + INR    Collection Time: 02/18/17  9:40 AM   Result Value Ref Range    Prothrombin time 12.4 11.5 - 15.2 sec    INR 1.0 0.8 - 1.2     GLUCOSE, POC    Collection Time: 02/18/17  9:42 AM   Result Value Ref Range    Glucose (POC) 107 70 - 110 mg/dL   EKG, 12 LEAD, INITIAL    Collection Time: 02/18/17 10:06 AM   Result Value Ref Range     Ventricular Rate 65 BPM    Atrial Rate 65 BPM    P-R Interval 204 ms    QRS Duration 86 ms    Q-T Interval 422 ms    QTC Calculation (Bezet) 438 ms    Calculated P Axis 63 degrees    Calculated R Axis 29 degrees    Calculated T Axis 20 degrees    Diagnosis       Sinus rhythm with premature supraventricular complexes  Septal infarct , age undetermined  Abnormal ECG  When compared with ECG of 13-Dec-2006 15:33,  premature supraventricular complexes are now present  Confirmed by Isabelle CourseGupta, Bhavdeep, MD, ----- ([id number]) on 02/18/2017 12:30:34 PM         Radiologic Studies -   CT HEAD WO CONT   Final Result   Impression:       Normal brain. There is a small amount of fluid in both maxillary sinuses.       Results called to emergency room at 1004 hours.        Ct Head Wo Cont    Result Date: 02/18/2017  CT OF THE BRAIN WITHOUT IV CONTRAST COMPARISON: None INDICATIONS: Stroke alert TECHNIQUE:  Axial sections through the brain at 5 mm collimation without IV contrast are obtained. All CT scans at this facility are performed using dose optimization technique as appropriate to a performed exam, to include automated exposure control, adjustment of the mA and/or KV according to patient's size (including appropriate matching for site-specific examinations), or use of iterative reconstruction technique. FINDINGS:  The brain is normal in appearance.  There is no evidence of hemorrhage, mass effect or midline shift.  No extra axial fluid collections are seen.  No bony abnormalities are seen. There is a small amount of fluid in both maxillary sinuses. Mild carotid artery calcifications are seen. No hemorrhage identified. No mass lesion identified. No acute infarction identified.     Impression: Normal brain. There is a small amount of fluid in both maxillary sinuses. Results called to emergency room at 1004 hours.      Medications ordered:   Medications - No data to display      Medical Decision Making    Initial Medical Decision Making and DDx:    Concerning 15 minutes of vertigo and headache. Concern for TIA. Initiated code S.  Not c/w meningitis.    ED Course: Progress Notes, Reevaluation, and Consults:    Consult:  Discussed care with Dr. Sonny Dandya Silva, Specialty: Neurology Standard discussion; including history of patient???s chief complaint, available diagnostic results, and treatment course. Isolated vertigo, not concerned with TIA or stroke. If work up is negative then D/C with symptomatic management.   9:53 PM, 02/18/2017     10:04 AM Head CT negative    10:12 AM Discussed results. Asymptomatic now, will prescribe Meclizine  and Ibuprofen for headache. Told pt to return if any stroke symptoms occur.     I am the first provider for this patient.    I reviewed the vital signs, available nursing notes, past medical history, past surgical history, family history and social history.    Vital Signs-Reviewed the patient's vital signs.    Pulse Oximetry Analysis - 100% on room air     Cardiac Monitor:  Rate/Rhythm:  77 bpm     EKG:Interpreted by the EP.   Time Interpreted: 10:06   Rate: 65 bpm   Rhythm: Sinus   Interpretation: Nothing acute    Records Reviewed: Nursing Notes and Old Medical Records (Time of Review: 1:05 PM)        Diagnosis     Clinical Impression:   1. Acute nonintractable headache, unspecified headache type    2. Vertigo      Disposition: D/C    Follow-up Information     Follow up With Specialties Details Why Contact Info    Marissa Nestle., MD General Practice In 2 days  [address]  [place] [address]  [phone]                Medication List      START taking these medications    meclizine 25 mg tablet  Commonly known as:  ANTIVERT  Take 1 tablet by mouth every 6 hours for the next 3 days.  Then stop taking the meclizine.  Restart the meclizine for 3 day intervals if vertigo/ dizziness returns.        ASK your doctor about these medications     lisinopril-hydroCHLOROthiazide 10-12.5 mg per tablet  Commonly known as:  PRINZIDE, ZESTORETIC           Where to Get Your Medications      Information about where to get these medications is not yet available    Ask your nurse or doctor about these medications  ?? meclizine 25 mg tablet       _______________________________    Attestations:  Scribe Attestation     I-Sis Wilson acting as a Neurosurgeon for and in the presence of No att. providers found      February 18, 2017 at 1:05 PM       Provider Attestation:      I personally performed the services described in the documentation, reviewed the documentation, as recorded by the scribe in my presence, and it accurately and completely records my words and actions. February 18, 2017 at 1:05 PM - No att. providers found    _______________________________

## 2017-02-18 NOTE — ED Notes (Signed)
Dr Linward HeadlandaSilva in room via petronus for exam and evaluation

## 2017-02-18 NOTE — Progress Notes (Signed)
Chaplain responded to a Code S for  Laurie Mercado, who is a 67 y.o.,female,     The Chaplain provided the following Interventions:    Offered prayers on behalf for the patient.   Chart reviewed.    The following outcomes were achieved:      Assessment:  There are no further spiritual or religious issues which require intervention at this time.     Plan:  Chaplains will continue to follow and will provide spiritual care as needed.  Chaplain recommends bedside caregivers page chaplain on duty if patient or family shows signs of acute spiritual or emotional distress.       Susette RacerSharon H. Harlen Danford, Chaplain  Spiritual Care  [phone]

## 2017-02-18 NOTE — ED Triage Notes (Signed)
I've been having headaches. And sometimes when I get up it feels like something is moving"

## 2017-02-18 NOTE — ED Notes (Signed)
Reviewed discharge instructions with the patient and her family.  This includes new medication and suggested follow-up.  Questions encouraged and answered.  All verbalized an understanding

## 2017-07-04 IMAGING — US US SOFT TISSUE HEAD/NECK
1 series · 14 of 25 positions shown · non-contrast
Comparison: None.

CLINICAL DATA: Thyroid disorder.

EXAM:
THYROID ULTRASOUND
TECHNIQUE: Ultrasound examination of the thyroid gland and adjacent soft
tissues was performed.

[Series 1: us soft tissue head/neck · 0.04mm/px · 14 of 51 slices shown]
[im 1/51]
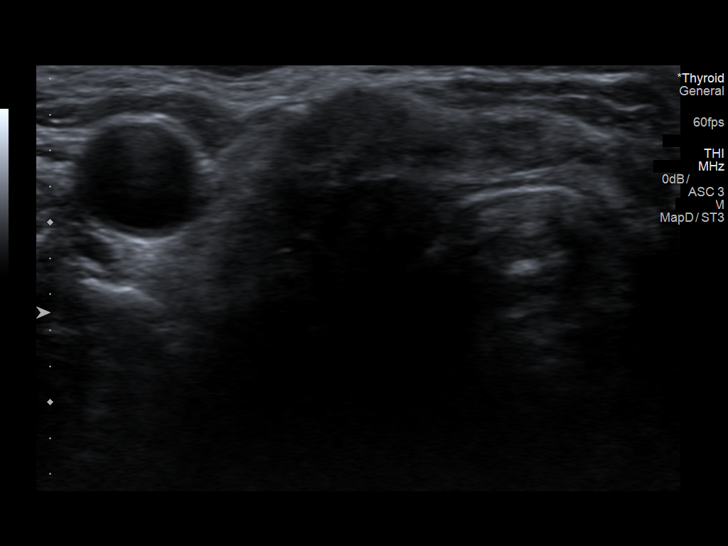
[im 5/51]
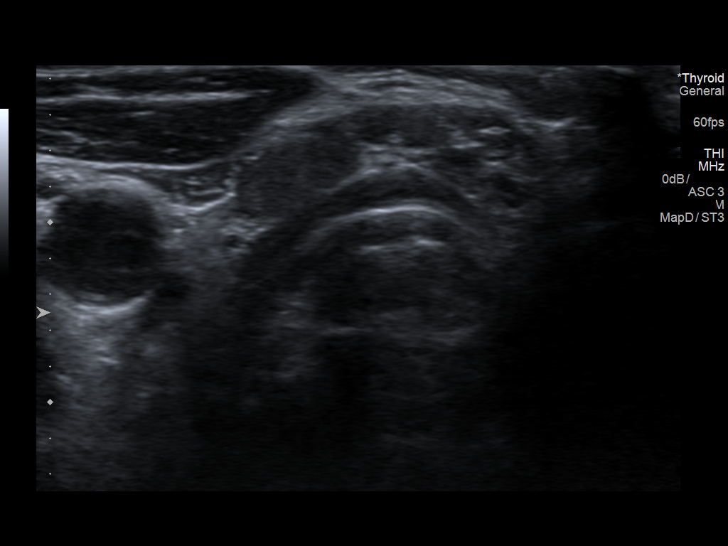
[im 9/51]
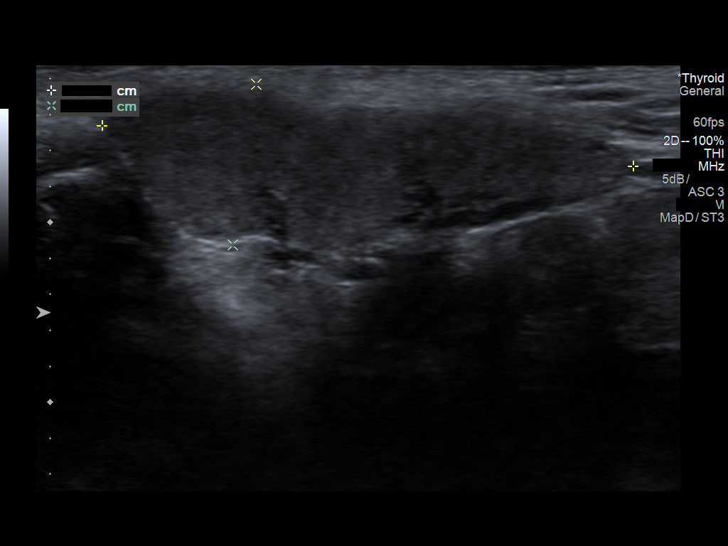
[im 13/51]
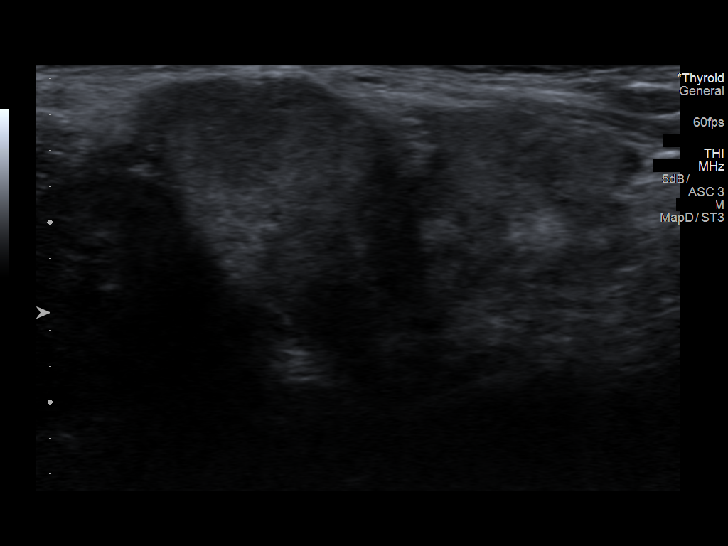
[im 17/51]
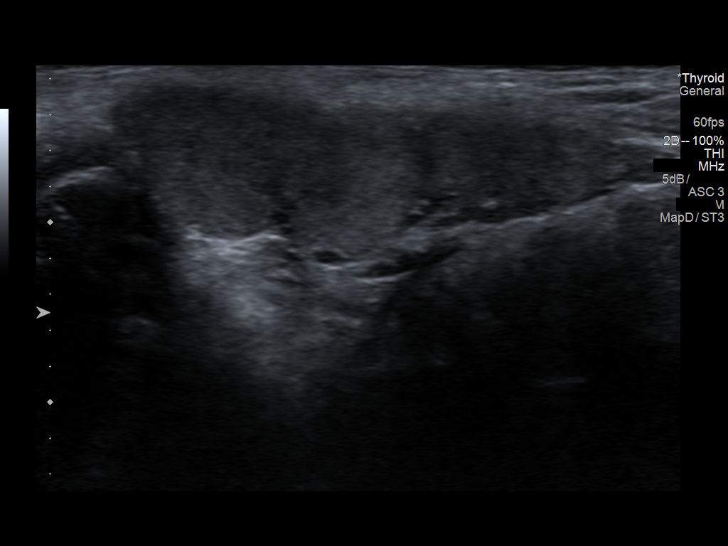
[im 19/51]
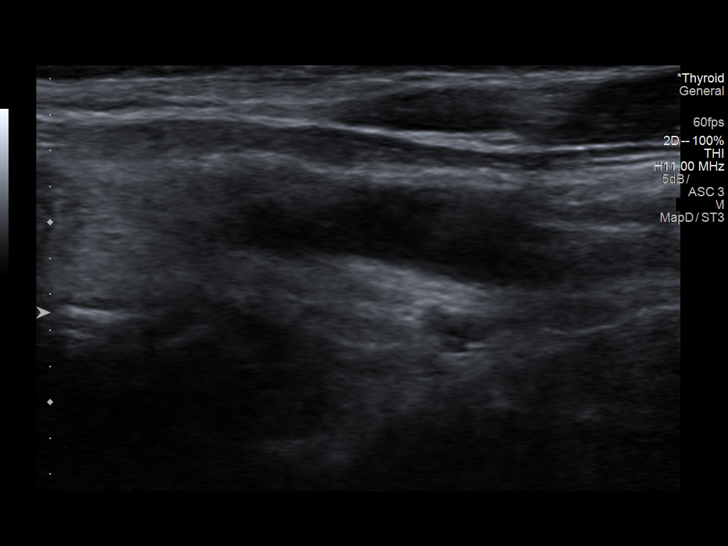
[im 23/51]
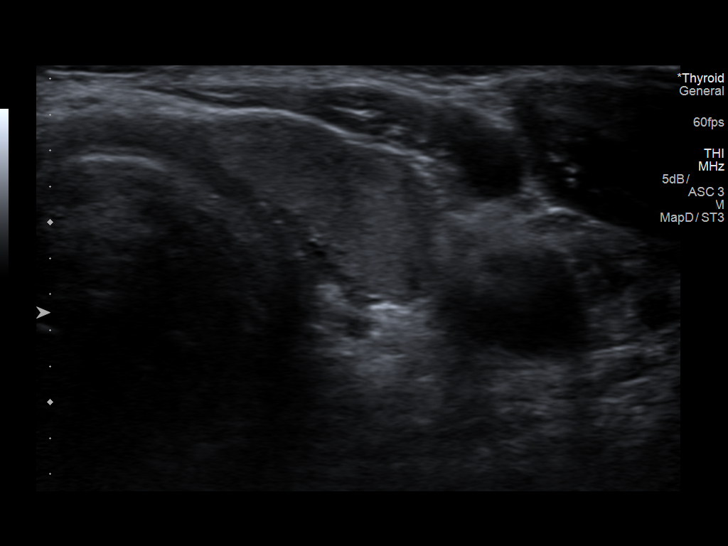
[im 28/51]
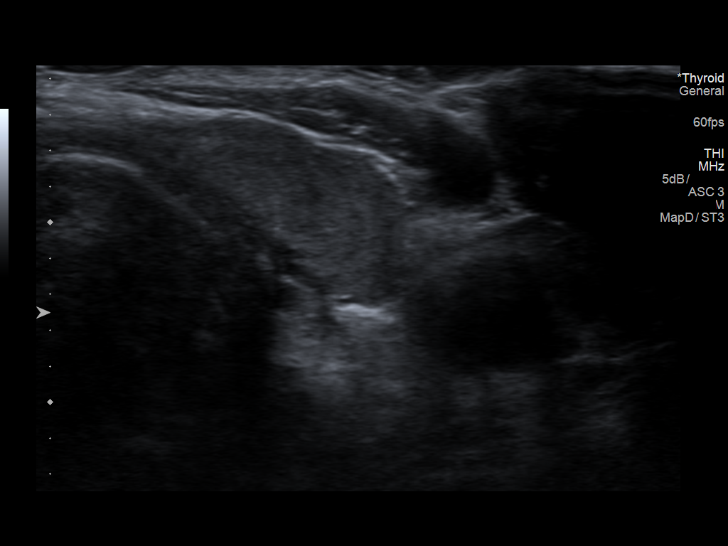
[im 32/51]
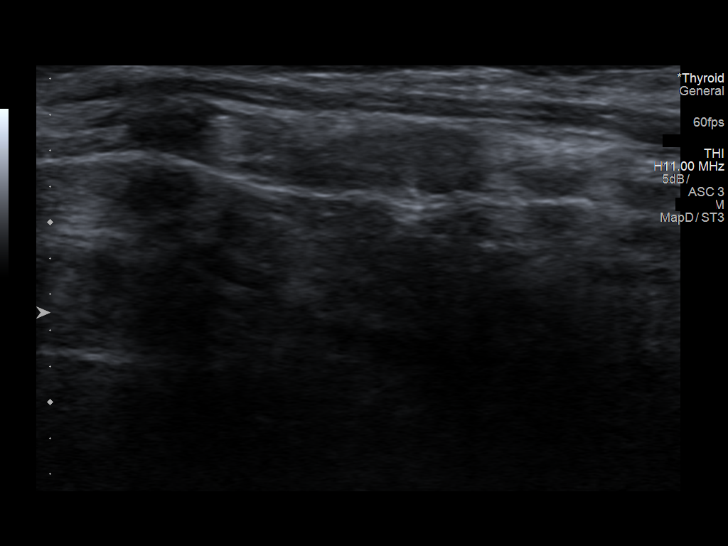
[im 34/51]
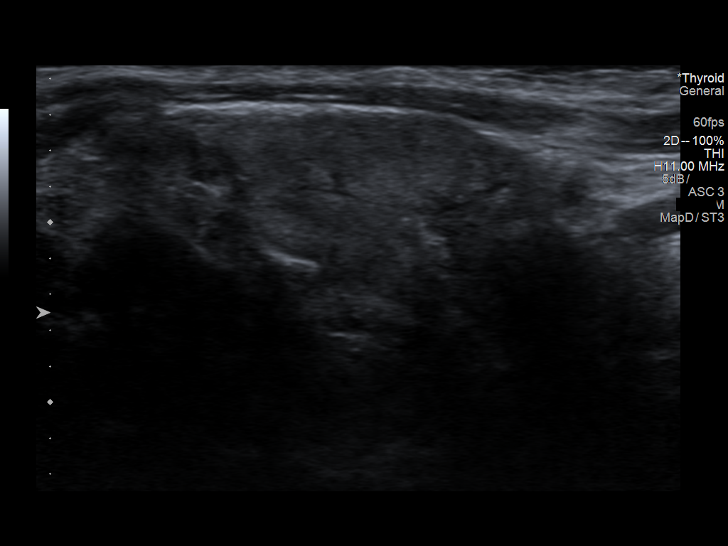
[im 38/51]
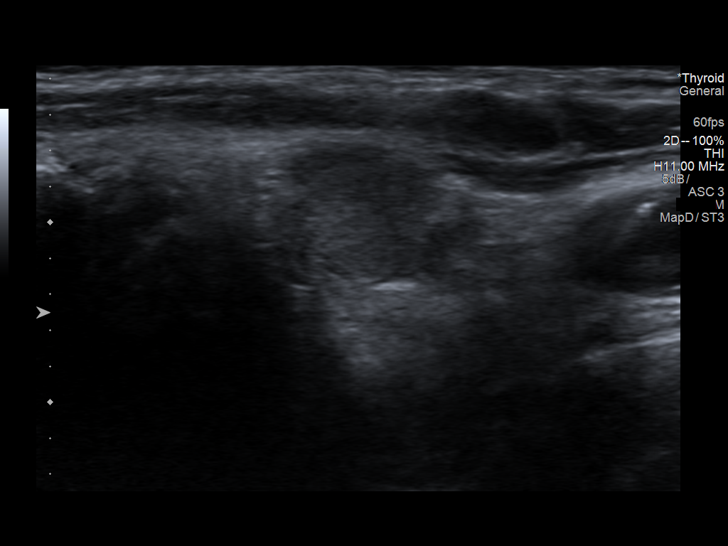
[im 42/51]
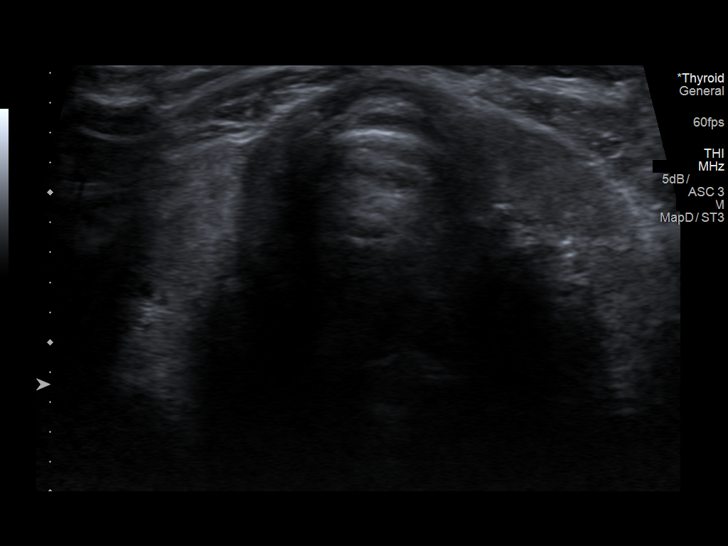
[im 46/51]
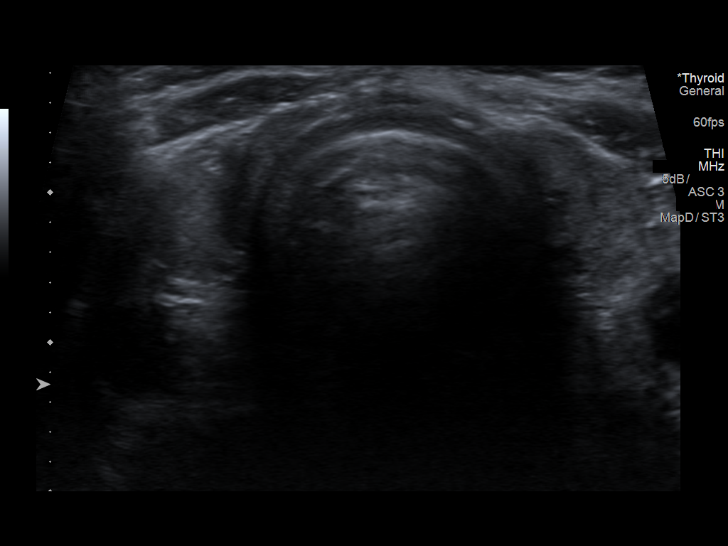
[im 51/51]
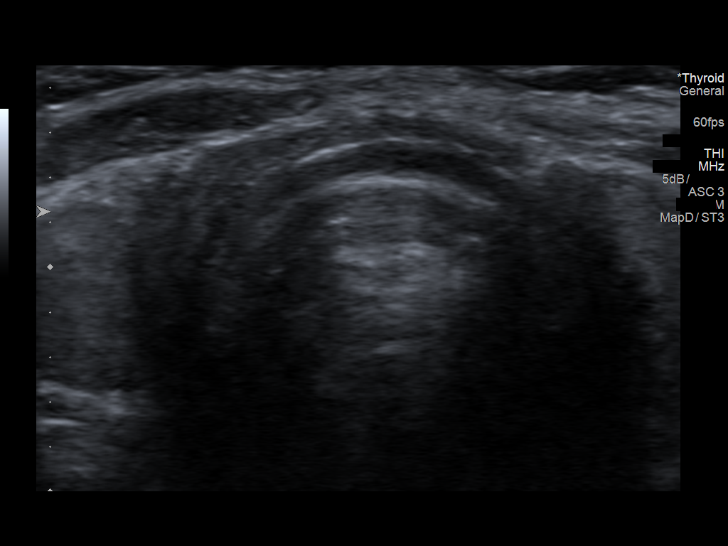

[14 of 25 positions shown; findings below may reference images not displayed]

FINDINGS: Right thyroid lobe

Measurements: 3.0 x 1.3 x 0.9 cm. Heterogeneous in echotexture. No
nodules visualized.

Left thyroid lobe

Measurements: 2.1 x 1.3 x 0.9 cm. Heterogeneous in echotexture. No
nodules visualized.

Isthmus

Thickness: 2 mm.  No nodules visualized.

Lymphadenopathy

None visualized.
IMPRESSION: No significant nodule or mass is noted. Heterogeneous echogenicity
of thyroid parenchyma is noted bilaterally.

## 2017-11-29 ENCOUNTER — Encounter

## 2017-12-18 ENCOUNTER — Inpatient Hospital Stay: Admit: 2017-12-18 | Payer: MEDICARE | Attending: Family Medicine | Primary: Nurse Practitioner

## 2017-12-18 DIAGNOSIS — Z1231 Encounter for screening mammogram for malignant neoplasm of breast: Secondary | ICD-10-CM

## 2017-12-18 DIAGNOSIS — M899 Disorder of bone, unspecified: Secondary | ICD-10-CM

## 2018-01-25 ENCOUNTER — Inpatient Hospital Stay: Admit: 2018-01-25 | Payer: MEDICARE | Primary: Nurse Practitioner

## 2018-01-25 ENCOUNTER — Encounter

## 2018-01-25 DIAGNOSIS — Z0000 Encounter for general adult medical examination without abnormal findings: Secondary | ICD-10-CM

## 2018-01-25 LAB — CBC WITH AUTO DIFFERENTIAL
Basophils %: 0 % (ref 0–2)
Basophils Absolute: 0 10*3/uL (ref 0.0–0.1)
Eosinophils %: 3 % (ref 0–5)
Eosinophils Absolute: 0.2 10*3/uL (ref 0.0–0.4)
Hematocrit: 31.5 % — ABNORMAL LOW (ref 35.0–45.0)
Hemoglobin: 10.1 g/dL — ABNORMAL LOW (ref 12.0–16.0)
Lymphocytes %: 45 % (ref 21–52)
Lymphocytes Absolute: 2.5 10*3/uL (ref 0.9–3.6)
MCH: 26.5 PG (ref 24.0–34.0)
MCHC: 32.1 g/dL (ref 31.0–37.0)
MCV: 82.7 FL (ref 74.0–97.0)
MPV: 9.6 FL (ref 9.2–11.8)
Monocytes %: 7 % (ref 3–10)
Monocytes Absolute: 0.4 10*3/uL (ref 0.05–1.2)
Neutrophils %: 45 % (ref 40–73)
Neutrophils Absolute: 2.4 10*3/uL (ref 1.8–8.0)
Platelets: 286 10*3/uL (ref 135–420)
RBC: 3.81 M/uL — ABNORMAL LOW (ref 4.20–5.30)
RDW: 14.5 % (ref 11.6–14.5)
WBC: 5.5 10*3/uL (ref 4.6–13.2)

## 2018-01-25 LAB — COMPREHENSIVE METABOLIC PANEL
ALT: 19 U/L (ref 13–56)
AST: 21 U/L (ref 10–38)
Albumin/Globulin Ratio: 0.9 (ref 0.8–1.7)
Albumin: 3.4 g/dL (ref 3.4–5.0)
Alkaline Phosphatase: 102 U/L (ref 45–117)
Anion Gap: 6 mmol/L (ref 3.0–18)
BUN: 16 MG/DL (ref 7.0–18)
Bun/Cre Ratio: 18 (ref 12–20)
CO2: 33 mmol/L — ABNORMAL HIGH (ref 21–32)
Calcium: 8.8 MG/DL (ref 8.5–10.1)
Chloride: 106 mmol/L (ref 100–111)
Creatinine: 0.89 MG/DL (ref 0.6–1.3)
EGFR IF NonAfrican American: >60 mL/min/{1.73_m2} (ref >60)
GFR African American: >60 mL/min/{1.73_m2} (ref >60)
Globulin: 3.6 g/dL (ref 2.0–4.0)
Glucose: 105 mg/dL — ABNORMAL HIGH (ref 74–99)
Potassium: 4.2 mmol/L (ref 3.5–5.5)
Sodium: 145 mmol/L (ref 136–145)
Total Bilirubin: 0.3 MG/DL (ref 0.2–1.0)
Total Protein: 7 g/dL (ref 6.4–8.2)

## 2018-01-25 LAB — LIPID PANEL
CHOL/HDL Ratio: 3 (ref 0–5.0)
Chol/HDL Ratio: 3 (ref 0–5.0)
Cholesterol, Total: 178 MG/DL (ref <200)
Cholesterol, total: 178 MG/DL (ref <200)
HDL Cholesterol: 60 MG/DL (ref 40–60)
HDL: 60 MG/DL (ref 40–60)
LDL Calculated: 103.8 MG/DL — ABNORMAL HIGH (ref 0–100)
LDL, calculated: 103.8 MG/DL — ABNORMAL HIGH (ref 0–100)
Triglyceride: 71 MG/DL (ref <150)
Triglycerides: 71 MG/DL (ref <150)
VLDL Cholesterol Calculated: 14.2 MG/DL
VLDL, calculated: 14.2 MG/DL

## 2018-01-25 LAB — METABOLIC PANEL, COMPREHENSIVE
A-G Ratio: 0.9 (ref 0.8–1.7)
ALT (SGPT): 19 U/L (ref 13–56)
AST (SGOT): 21 U/L (ref 10–38)
Albumin: 3.4 g/dL (ref 3.4–5.0)
Alk. phosphatase: 102 U/L (ref 45–117)
Anion gap: 6 mmol/L (ref 3.0–18)
BUN/Creatinine ratio: 18 (ref 12–20)
BUN: 16 MG/DL (ref 7.0–18)
Bilirubin, total: 0.3 MG/DL (ref 0.2–1.0)
CO2: 33 mmol/L — ABNORMAL HIGH (ref 21–32)
Calcium: 8.8 MG/DL (ref 8.5–10.1)
Chloride: 106 mmol/L (ref 100–111)
Creatinine: 0.89 MG/DL (ref 0.6–1.3)
GFR est AA: >60 mL/min/{1.73_m2} (ref >60)
GFR est non-AA: >60 mL/min/{1.73_m2} (ref >60)
Globulin: 3.6 g/dL (ref 2.0–4.0)
Glucose: 105 mg/dL — ABNORMAL HIGH (ref 74–99)
Potassium: 4.2 mmol/L (ref 3.5–5.5)
Protein, total: 7 g/dL (ref 6.4–8.2)
Sodium: 145 mmol/L (ref 136–145)

## 2018-01-25 LAB — CBC WITH AUTOMATED DIFF
ABS. BASOPHILS: 0 10*3/uL (ref 0.0–0.1)
ABS. EOSINOPHILS: 0.2 10*3/uL (ref 0.0–0.4)
ABS. LYMPHOCYTES: 2.5 10*3/uL (ref 0.9–3.6)
ABS. MONOCYTES: 0.4 10*3/uL (ref 0.05–1.2)
ABS. NEUTROPHILS: 2.4 10*3/uL (ref 1.8–8.0)
BASOPHILS: 0 % (ref 0–2)
EOSINOPHILS: 3 % (ref 0–5)
HCT: 31.5 % — ABNORMAL LOW (ref 35.0–45.0)
HGB: 10.1 g/dL — ABNORMAL LOW (ref 12.0–16.0)
LYMPHOCYTES: 45 % (ref 21–52)
MCH: 26.5 PG (ref 24.0–34.0)
MCHC: 32.1 g/dL (ref 31.0–37.0)
MCV: 82.7 FL (ref 74.0–97.0)
MONOCYTES: 7 % (ref 3–10)
MPV: 9.6 FL (ref 9.2–11.8)
NEUTROPHILS: 45 % (ref 40–73)
PLATELET: 286 10*3/uL (ref 135–420)
RBC: 3.81 M/uL — ABNORMAL LOW (ref 4.20–5.30)
RDW: 14.5 % (ref 11.6–14.5)
WBC: 5.5 10*3/uL (ref 4.6–13.2)

## 2018-06-06 ENCOUNTER — Inpatient Hospital Stay: Admit: 2018-06-06 | Payer: MEDICARE | Primary: Nurse Practitioner

## 2018-06-06 ENCOUNTER — Encounter

## 2018-06-06 DIAGNOSIS — D509 Iron deficiency anemia, unspecified: Secondary | ICD-10-CM

## 2018-06-06 LAB — CBC WITH AUTO DIFFERENTIAL
Basophils %: 0 % (ref 0–3)
Basophils Absolute: 0 10*3/uL (ref 0.0–0.06)
Eosinophils %: 3 % (ref 0–5)
Eosinophils Absolute: 0.2 10*3/uL (ref 0.0–0.4)
Hematocrit: 32.4 % — ABNORMAL LOW (ref 35.0–45.0)
Hemoglobin: 10.3 g/dL — ABNORMAL LOW (ref 12.0–16.0)
Lymphocytes %: 45 % (ref 20–51)
Lymphocytes Absolute: 3 10*3/uL (ref 0.8–3.5)
MCH: 26.1 PG (ref 24.0–34.0)
MCHC: 31.8 g/dL (ref 31.0–37.0)
MCV: 82 FL (ref 74.0–97.0)
MPV: 9.7 FL (ref 9.2–11.8)
Monocytes %: 7 % (ref 2–9)
Monocytes Absolute: 0.5 10*3/uL (ref 0–1.0)
Neutrophils %: 45 % (ref 42–75)
Neutrophils Absolute: 3 10*3/uL (ref 1.8–8.0)
Platelet Comment: ADEQUATE
Platelets: 317 10*3/uL (ref 135–420)
RBC: 3.95 M/uL — ABNORMAL LOW (ref 4.20–5.30)
RDW: 14.2 % (ref 11.6–14.5)
WBC: 6.7 10*3/uL (ref 4.6–13.2)

## 2018-06-06 LAB — HEMOGLOBIN A1C W/O EAG
Hemoglobin A1C: 6 % — ABNORMAL HIGH (ref 4.2–5.6)
Hemoglobin A1c: 6 % — ABNORMAL HIGH (ref 4.2–5.6)

## 2018-06-06 LAB — CBC WITH AUTOMATED DIFF
ABS. BASOPHILS: 0 10*3/uL (ref 0.0–0.06)
ABS. EOSINOPHILS: 0.2 10*3/uL (ref 0.0–0.4)
ABS. LYMPHOCYTES: 3 10*3/uL (ref 0.8–3.5)
ABS. MONOCYTES: 0.5 10*3/uL (ref 0–1.0)
ABS. NEUTROPHILS: 3 10*3/uL (ref 1.8–8.0)
BASOPHILS: 0 % (ref 0–3)
EOSINOPHILS: 3 % (ref 0–5)
HCT: 32.4 % — ABNORMAL LOW (ref 35.0–45.0)
HGB: 10.3 g/dL — ABNORMAL LOW (ref 12.0–16.0)
LYMPHOCYTES: 45 % (ref 20–51)
MCH: 26.1 PG (ref 24.0–34.0)
MCHC: 31.8 g/dL (ref 31.0–37.0)
MCV: 82 FL (ref 74.0–97.0)
MONOCYTES: 7 % (ref 2–9)
MPV: 9.7 FL (ref 9.2–11.8)
NEUTROPHILS: 45 % (ref 42–75)
PLATELET COMMENTS: ADEQUATE
PLATELET: 317 10*3/uL (ref 135–420)
RBC: 3.95 M/uL — ABNORMAL LOW (ref 4.20–5.30)
RDW: 14.2 % (ref 11.6–14.5)
WBC: 6.7 10*3/uL (ref 4.6–13.2)

## 2018-12-31 ENCOUNTER — Inpatient Hospital Stay: Admit: 2018-12-31 | Payer: MEDICARE | Primary: Nurse Practitioner

## 2018-12-31 ENCOUNTER — Encounter

## 2018-12-31 DIAGNOSIS — R7303 Prediabetes: Secondary | ICD-10-CM

## 2018-12-31 LAB — CBC WITH AUTO DIFFERENTIAL
Basophils %: 0 % (ref 0–2)
Basophils Absolute: 0 10*3/uL (ref 0.0–0.1)
Eosinophils %: 2 % (ref 0–5)
Eosinophils Absolute: 0.1 10*3/uL (ref 0.0–0.4)
Hematocrit: 31.4 % — ABNORMAL LOW (ref 35.0–45.0)
Hemoglobin: 10.2 g/dL — ABNORMAL LOW (ref 12.0–16.0)
Lymphocytes %: 47 % (ref 21–52)
Lymphocytes Absolute: 2.7 10*3/uL (ref 0.9–3.6)
MCH: 26.8 PG (ref 24.0–34.0)
MCHC: 32.5 g/dL (ref 31.0–37.0)
MCV: 82.6 FL (ref 74.0–97.0)
MPV: 9.5 FL (ref 9.2–11.8)
Monocytes %: 7 % (ref 3–10)
Monocytes Absolute: 0.4 10*3/uL (ref 0.05–1.2)
Neutrophils %: 44 % (ref 40–73)
Neutrophils Absolute: 2.5 10*3/uL (ref 1.8–8.0)
Platelets: 332 10*3/uL (ref 135–420)
RBC: 3.8 M/uL — ABNORMAL LOW (ref 4.20–5.30)
RDW: 15.1 % — ABNORMAL HIGH (ref 11.6–14.5)
WBC: 5.7 10*3/uL (ref 4.6–13.2)

## 2018-12-31 LAB — LIPID PANEL
CHOL/HDL Ratio: 4.3 (ref 0–5.0)
Chol/HDL Ratio: 4.3 (ref 0–5.0)
Cholesterol, Total: 255 MG/DL — ABNORMAL HIGH (ref <200)
Cholesterol, total: 255 MG/DL — ABNORMAL HIGH (ref <200)
HDL Cholesterol: 59 MG/DL (ref 40–60)
HDL: 59 MG/DL (ref 40–60)
LDL Calculated: 179 MG/DL — ABNORMAL HIGH (ref 0–100)
LDL, calculated: 179 MG/DL — ABNORMAL HIGH (ref 0–100)
Triglyceride: 85 MG/DL (ref <150)
Triglycerides: 85 MG/DL (ref <150)
VLDL Cholesterol Calculated: 17 MG/DL
VLDL, calculated: 17 MG/DL

## 2018-12-31 LAB — HEMOGLOBIN A1C W/O EAG
Hemoglobin A1C: 5.9 % — ABNORMAL HIGH (ref 4.2–5.6)
Hemoglobin A1c: 5.9 % — ABNORMAL HIGH (ref 4.2–5.6)

## 2018-12-31 LAB — CBC WITH AUTOMATED DIFF
ABS. BASOPHILS: 0 10*3/uL (ref 0.0–0.1)
ABS. EOSINOPHILS: 0.1 10*3/uL (ref 0.0–0.4)
ABS. LYMPHOCYTES: 2.7 10*3/uL (ref 0.9–3.6)
ABS. MONOCYTES: 0.4 10*3/uL (ref 0.05–1.2)
ABS. NEUTROPHILS: 2.5 10*3/uL (ref 1.8–8.0)
BASOPHILS: 0 % (ref 0–2)
EOSINOPHILS: 2 % (ref 0–5)
HCT: 31.4 % — ABNORMAL LOW (ref 35.0–45.0)
HGB: 10.2 g/dL — ABNORMAL LOW (ref 12.0–16.0)
LYMPHOCYTES: 47 % (ref 21–52)
MCH: 26.8 PG (ref 24.0–34.0)
MCHC: 32.5 g/dL (ref 31.0–37.0)
MCV: 82.6 FL (ref 74.0–97.0)
MONOCYTES: 7 % (ref 3–10)
MPV: 9.5 FL (ref 9.2–11.8)
NEUTROPHILS: 44 % (ref 40–73)
PLATELET: 332 10*3/uL (ref 135–420)
RBC: 3.8 M/uL — ABNORMAL LOW (ref 4.20–5.30)
RDW: 15.1 % — ABNORMAL HIGH (ref 11.6–14.5)
WBC: 5.7 10*3/uL (ref 4.6–13.2)

## 2019-03-20 ENCOUNTER — Encounter

## 2019-05-28 ENCOUNTER — Inpatient Hospital Stay: Payer: MEDICARE | Attending: Family | Primary: Nurse Practitioner

## 2019-05-28 DIAGNOSIS — Z1231 Encounter for screening mammogram for malignant neoplasm of breast: Secondary | ICD-10-CM

## 2019-06-30 ENCOUNTER — Encounter: Payer: Self-pay | Admitting: Internal Medicine

## 2019-08-02 ENCOUNTER — Ambulatory Visit: Payer: MEDICARE | Primary: Nurse Practitioner

## 2019-08-23 ENCOUNTER — Ambulatory Visit: Payer: MEDICARE | Primary: Nurse Practitioner

## 2019-09-20 ENCOUNTER — Inpatient Hospital Stay: Admit: 2019-09-20 | Payer: MEDICARE | Attending: Family | Primary: Nurse Practitioner

## 2019-09-20 DIAGNOSIS — Z1231 Encounter for screening mammogram for malignant neoplasm of breast: Secondary | ICD-10-CM

## 2020-01-19 ENCOUNTER — Encounter

## 2020-01-19 ENCOUNTER — Inpatient Hospital Stay: Admit: 2020-01-19 | Payer: MEDICARE | Primary: Nurse Practitioner

## 2020-01-19 DIAGNOSIS — Z1329 Encounter for screening for other suspected endocrine disorder: Secondary | ICD-10-CM

## 2020-01-19 LAB — CBC WITH AUTO DIFFERENTIAL
Basophils %: 0 % (ref 0–2)
Basophils Absolute: 0 10*3/uL (ref 0.0–0.1)
Eosinophils %: 3 % (ref 0–5)
Eosinophils Absolute: 0.1 10*3/uL (ref 0.0–0.4)
Granulocyte Absolute Count: 0 10*3/uL (ref 0.00–0.04)
Hematocrit: 33.1 % — ABNORMAL LOW (ref 35.0–45.0)
Hemoglobin: 10.2 g/dL — ABNORMAL LOW (ref 12.0–16.0)
Immature Granulocytes: 0 % (ref 0.0–0.5)
Lymphocytes %: 46 % (ref 21–52)
Lymphocytes Absolute: 2.2 10*3/uL (ref 0.9–3.6)
MCH: 26 PG (ref 24.0–34.0)
MCHC: 30.8 g/dL — ABNORMAL LOW (ref 31.0–37.0)
MCV: 84.2 FL (ref 78.0–100.0)
MPV: 9.6 FL (ref 9.2–11.8)
Monocytes %: 9 % (ref 3–10)
Monocytes Absolute: 0.4 10*3/uL (ref 0.05–1.2)
NRBC Absolute: 0 10*3/uL (ref 0.00–0.01)
Neutrophils %: 42 % (ref 40–73)
Neutrophils Absolute: 2 10*3/uL (ref 1.8–8.0)
Nucleated RBCs: 0 PER 100 WBC
Platelets: 279 10*3/uL (ref 135–420)
RBC: 3.93 M/uL — ABNORMAL LOW (ref 4.20–5.30)
RDW: 14.5 % (ref 11.6–14.5)
WBC: 4.7 10*3/uL (ref 4.6–13.2)

## 2020-01-19 LAB — HEMOGLOBIN A1C W/EAG
Hemoglobin A1C: 5.8 % — ABNORMAL HIGH (ref 4.2–5.6)
eAG: 120 mg/dL

## 2020-01-19 LAB — LIPID PANEL
CHOL/HDL Ratio: 3 (ref 0–5.0)
Chol/HDL Ratio: 3 (ref 0–5.0)
Cholesterol, Total: 202 MG/DL — ABNORMAL HIGH (ref <200)
Cholesterol, total: 202 MG/DL — ABNORMAL HIGH (ref <200)
HDL Cholesterol: 67 MG/DL — ABNORMAL HIGH (ref 40–60)
HDL: 67 MG/DL — ABNORMAL HIGH (ref 40–60)
LDL Calculated: 120.2 MG/DL — ABNORMAL HIGH (ref 0–100)
LDL, calculated: 120.2 MG/DL — ABNORMAL HIGH (ref 0–100)
Triglyceride: 74 MG/DL (ref <150)
Triglycerides: 74 MG/DL (ref <150)
VLDL Cholesterol Calculated: 14.8 MG/DL
VLDL, calculated: 14.8 MG/DL

## 2020-01-19 LAB — COMPREHENSIVE METABOLIC PANEL
ALT: 19 U/L (ref 13–56)
AST: 24 U/L (ref 10–38)
Albumin/Globulin Ratio: 1 (ref 0.8–1.7)
Albumin: 3.6 g/dL (ref 3.4–5.0)
Alkaline Phosphatase: 95 U/L (ref 45–117)
Anion Gap: 2 mmol/L — ABNORMAL LOW (ref 3.0–18)
BUN: 18 MG/DL (ref 7.0–18)
Bun/Cre Ratio: 22 — ABNORMAL HIGH (ref 12–20)
CO2: 33 mmol/L — ABNORMAL HIGH (ref 21–32)
Calcium: 9 MG/DL (ref 8.5–10.1)
Chloride: 106 mmol/L (ref 100–111)
Creatinine: 0.83 MG/DL (ref 0.6–1.3)
EGFR IF NonAfrican American: >60 mL/min/{1.73_m2} (ref >60)
GFR African American: >60 mL/min/{1.73_m2} (ref >60)
Globulin: 3.5 g/dL (ref 2.0–4.0)
Glucose: 91 mg/dL (ref 74–99)
Potassium: 3.8 mmol/L (ref 3.5–5.5)
Sodium: 141 mmol/L (ref 136–145)
Total Bilirubin: 0.3 MG/DL (ref 0.2–1.0)
Total Protein: 7.1 g/dL (ref 6.4–8.2)

## 2020-01-19 LAB — T4, FREE
T4 Free: 0.9 NG/DL (ref 0.7–1.5)
T4, Free: 0.9 NG/DL (ref 0.7–1.5)

## 2020-01-19 LAB — TSH 3RD GENERATION
TSH: 3.13 u[IU]/mL (ref 0.36–3.74)
TSH: 3.13 u[IU]/mL (ref 0.36–3.74)

## 2020-01-19 LAB — VITAMIN D 25 HYDROXY: Vit D, 25-Hydroxy: 64.4 ng/mL (ref 30–100)

## 2020-01-19 LAB — METABOLIC PANEL, COMPREHENSIVE
A-G Ratio: 1 (ref 0.8–1.7)
ALT (SGPT): 19 U/L (ref 13–56)
AST (SGOT): 24 U/L (ref 10–38)
Albumin: 3.6 g/dL (ref 3.4–5.0)
Alk. phosphatase: 95 U/L (ref 45–117)
Anion gap: 2 mmol/L — ABNORMAL LOW (ref 3.0–18)
BUN/Creatinine ratio: 22 — ABNORMAL HIGH (ref 12–20)
BUN: 18 MG/DL (ref 7.0–18)
Bilirubin, total: 0.3 MG/DL (ref 0.2–1.0)
CO2: 33 mmol/L — ABNORMAL HIGH (ref 21–32)
Calcium: 9 MG/DL (ref 8.5–10.1)
Chloride: 106 mmol/L (ref 100–111)
Creatinine: 0.83 MG/DL (ref 0.6–1.3)
GFR est AA: >60 mL/min/{1.73_m2} (ref >60)
GFR est non-AA: >60 mL/min/{1.73_m2} (ref >60)
Globulin: 3.5 g/dL (ref 2.0–4.0)
Glucose: 91 mg/dL (ref 74–99)
Potassium: 3.8 mmol/L (ref 3.5–5.5)
Protein, total: 7.1 g/dL (ref 6.4–8.2)
Sodium: 141 mmol/L (ref 136–145)

## 2020-01-19 LAB — CBC WITH AUTOMATED DIFF
ABS. BASOPHILS: 0 10*3/uL (ref 0.0–0.1)
ABS. EOSINOPHILS: 0.1 10*3/uL (ref 0.0–0.4)
ABS. IMM. GRANS.: 0 10*3/uL (ref 0.00–0.04)
ABS. LYMPHOCYTES: 2.2 10*3/uL (ref 0.9–3.6)
ABS. MONOCYTES: 0.4 10*3/uL (ref 0.05–1.2)
ABS. NEUTROPHILS: 2 10*3/uL (ref 1.8–8.0)
ABSOLUTE NRBC: 0 10*3/uL (ref 0.00–0.01)
BASOPHILS: 0 % (ref 0–2)
EOSINOPHILS: 3 % (ref 0–5)
HCT: 33.1 % — ABNORMAL LOW (ref 35.0–45.0)
HGB: 10.2 g/dL — ABNORMAL LOW (ref 12.0–16.0)
IMMATURE GRANULOCYTES: 0 % (ref 0.0–0.5)
LYMPHOCYTES: 46 % (ref 21–52)
MCH: 26 PG (ref 24.0–34.0)
MCHC: 30.8 g/dL — ABNORMAL LOW (ref 31.0–37.0)
MCV: 84.2 FL (ref 78.0–100.0)
MONOCYTES: 9 % (ref 3–10)
MPV: 9.6 FL (ref 9.2–11.8)
NEUTROPHILS: 42 % (ref 40–73)
NRBC: 0 PER 100 WBC
PLATELET: 279 10*3/uL (ref 135–420)
RBC: 3.93 M/uL — ABNORMAL LOW (ref 4.20–5.30)
RDW: 14.5 % (ref 11.6–14.5)
WBC: 4.7 10*3/uL (ref 4.6–13.2)

## 2020-01-19 LAB — HEMOGLOBIN A1C WITH EAG
Est. average glucose: 120 mg/dL
Hemoglobin A1c: 5.8 % — ABNORMAL HIGH (ref 4.2–5.6)

## 2020-01-19 LAB — VITAMIN D, 25 HYDROXY: Vitamin D 25-Hydroxy: 64.4 ng/mL (ref 30–100)

## 2020-02-11 ENCOUNTER — Encounter

## 2020-02-23 ENCOUNTER — Encounter

## 2020-02-24 ENCOUNTER — Encounter

## 2020-02-24 ENCOUNTER — Inpatient Hospital Stay: Payer: MEDICARE | Attending: Registered Nurse | Primary: Nurse Practitioner

## 2020-03-11 ENCOUNTER — Inpatient Hospital Stay: Admit: 2020-03-11 | Payer: MEDICARE | Attending: Registered Nurse | Primary: Nurse Practitioner

## 2020-03-11 ENCOUNTER — Encounter

## 2020-03-11 DIAGNOSIS — N6324 Unspecified lump in the left breast, lower inner quadrant: Secondary | ICD-10-CM

## 2020-03-12 ENCOUNTER — Encounter

## 2020-03-19 ENCOUNTER — Encounter

## 2020-07-07 ENCOUNTER — Emergency Department: Admit: 2020-07-07 | Payer: MEDICARE | Primary: Nurse Practitioner

## 2020-07-07 ENCOUNTER — Inpatient Hospital Stay
Admit: 2020-07-07 | Discharge: 2020-07-07 | Disposition: A | Discharge status: Home / Self Care | Payer: MEDICARE | Attending: Emergency Medicine

## 2020-07-07 DIAGNOSIS — G44209 Tension-type headache, unspecified, not intractable: Secondary | ICD-10-CM

## 2020-07-07 LAB — CBC WITH AUTO DIFFERENTIAL
Basophils %: 0 % (ref 0–2)
Basophils Absolute: 0 10*3/uL (ref 0.0–0.1)
Eosinophils %: 1 % (ref 0–5)
Eosinophils Absolute: 0.1 10*3/uL (ref 0.0–0.4)
Granulocyte Absolute Count: 0 10*3/uL (ref 0.00–0.04)
Hematocrit: 31.6 % — ABNORMAL LOW (ref 35.0–45.0)
Hemoglobin: 10.3 g/dL — ABNORMAL LOW (ref 12.0–16.0)
Immature Granulocytes: 0 % (ref 0.0–0.5)
Lymphocytes %: 44 % (ref 21–52)
Lymphocytes Absolute: 2.6 10*3/uL (ref 0.9–3.6)
MCH: 26.6 PG (ref 24.0–34.0)
MCHC: 32.6 g/dL (ref 31.0–37.0)
MCV: 81.7 FL (ref 78.0–100.0)
MPV: 9.8 FL (ref 9.2–11.8)
Monocytes %: 9 % (ref 3–10)
Monocytes Absolute: 0.5 10*3/uL (ref 0.05–1.2)
NRBC Absolute: 0 10*3/uL (ref 0.00–0.01)
Neutrophils %: 45 % (ref 40–73)
Neutrophils Absolute: 2.7 10*3/uL (ref 1.8–8.0)
Nucleated RBCs: 0 PER 100 WBC
Platelets: 324 10*3/uL (ref 135–420)
RBC: 3.87 M/uL — ABNORMAL LOW (ref 4.20–5.30)
RDW: 13.9 % (ref 11.6–14.5)
WBC: 5.9 10*3/uL (ref 4.6–13.2)

## 2020-07-07 LAB — BASIC METABOLIC PANEL
Anion Gap: 6 mmol/L (ref 3.0–18)
BUN: 14 MG/DL (ref 7.0–18)
Bun/Cre Ratio: 16 (ref 12–20)
CO2: 31 mmol/L (ref 21–32)
Calcium: 9.2 MG/DL (ref 8.5–10.1)
Chloride: 102 mmol/L (ref 100–111)
Creatinine: 0.86 MG/DL (ref 0.6–1.3)
EGFR IF NonAfrican American: >60 mL/min/{1.73_m2} (ref >60)
GFR African American: >60 mL/min/{1.73_m2} (ref >60)
Glucose: 94 mg/dL (ref 74–99)
Potassium: 3.6 mmol/L (ref 3.5–5.5)
Sodium: 139 mmol/L (ref 136–145)

## 2020-07-07 LAB — URINALYSIS W/ RFLX MICROSCOPIC
Bilirubin, Urine: NEGATIVE
Bilirubin: NEGATIVE
Blood, Urine: NEGATIVE
Blood: NEGATIVE
Glucose, Ur: NEGATIVE mg/dL
Glucose: NEGATIVE mg/dL
Ketone: NEGATIVE mg/dL
Ketones, Urine: NEGATIVE mg/dL
Leukocyte Esterase, Urine: NEGATIVE
Leukocyte Esterase: NEGATIVE
Nitrite, Urine: NEGATIVE
Nitrites: NEGATIVE
Protein, UA: NEGATIVE mg/dL
Protein: NEGATIVE mg/dL
Specific Gravity, UA: 1.023 (ref 1.005–1.030)
Specific gravity: 1.023 (ref 1.005–1.030)
Urobilinogen, UA, POCT: 0.2 EU/dL (ref 0.2–1.0)
Urobilinogen: 0.2 EU/dL (ref 0.2–1.0)
pH (UA): 6.5 (ref 5.0–8.0)
pH, UA: 6.5 (ref 5.0–8.0)

## 2020-07-07 LAB — CBC WITH AUTOMATED DIFF
ABS. BASOPHILS: 0 10*3/uL (ref 0.0–0.1)
ABS. EOSINOPHILS: 0.1 10*3/uL (ref 0.0–0.4)
ABS. IMM. GRANS.: 0 10*3/uL (ref 0.00–0.04)
ABS. LYMPHOCYTES: 2.6 10*3/uL (ref 0.9–3.6)
ABS. MONOCYTES: 0.5 10*3/uL (ref 0.05–1.2)
ABS. NEUTROPHILS: 2.7 10*3/uL (ref 1.8–8.0)
ABSOLUTE NRBC: 0 10*3/uL (ref 0.00–0.01)
BASOPHILS: 0 % (ref 0–2)
EOSINOPHILS: 1 % (ref 0–5)
HCT: 31.6 % — ABNORMAL LOW (ref 35.0–45.0)
HGB: 10.3 g/dL — ABNORMAL LOW (ref 12.0–16.0)
IMMATURE GRANULOCYTES: 0 % (ref 0.0–0.5)
LYMPHOCYTES: 44 % (ref 21–52)
MCH: 26.6 PG (ref 24.0–34.0)
MCHC: 32.6 g/dL (ref 31.0–37.0)
MCV: 81.7 FL (ref 78.0–100.0)
MONOCYTES: 9 % (ref 3–10)
MPV: 9.8 FL (ref 9.2–11.8)
NEUTROPHILS: 45 % (ref 40–73)
NRBC: 0 PER 100 WBC
PLATELET: 324 10*3/uL (ref 135–420)
RBC: 3.87 M/uL — ABNORMAL LOW (ref 4.20–5.30)
RDW: 13.9 % (ref 11.6–14.5)
WBC: 5.9 10*3/uL (ref 4.6–13.2)

## 2020-07-07 LAB — METABOLIC PANEL, BASIC
Anion gap: 6 mmol/L (ref 3.0–18)
BUN/Creatinine ratio: 16 (ref 12–20)
BUN: 14 MG/DL (ref 7.0–18)
CO2: 31 mmol/L (ref 21–32)
Calcium: 9.2 MG/DL (ref 8.5–10.1)
Chloride: 102 mmol/L (ref 100–111)
Creatinine: 0.86 MG/DL (ref 0.6–1.3)
GFR est AA: >60 mL/min/{1.73_m2} (ref >60)
GFR est non-AA: >60 mL/min/{1.73_m2} (ref >60)
Glucose: 94 mg/dL (ref 74–99)
Potassium: 3.6 mmol/L (ref 3.5–5.5)
Sodium: 139 mmol/L (ref 136–145)

## 2020-07-07 MED ORDER — ACETAMINOPHEN 500 MG TAB
500 mg | ORAL | Status: AC
Start: 2020-07-07 — End: 2020-07-07
  Administered 2020-07-07: 22:00:00 via ORAL

## 2020-07-07 MED FILL — ACETAMINOPHEN 500 MG TAB: 500 mg | ORAL | Qty: 2

## 2020-07-07 NOTE — Progress Notes (Signed)
Discharge instructions given to patient. Follow up information provided. Prescriptions given as ordered. Verbalized understanding.

## 2020-07-07 NOTE — ED Notes (Signed)
Patient states hx of vertigo.  She states having frontal headache and dizziness x 3 days.

## 2020-07-07 NOTE — ED Provider Notes (Signed)
EMERGENCY DEPARTMENT HISTORY AND PHYSICAL EXAM  [facility]        Date: 6/1/Mercado  Patient Name: Laurie Mercado    History of Presenting Illness     Chief Complaint   Patient presents with   ??? Headache   ??? Dizziness       History Provided By: Patient    HPI:  Laurie Mercado is a 70 y.o. female with a history of development delay, htn, iron deficiency aneai who presents with complaints of uneasy feeling in her head, some pressure between her eyes, at her forehead.  She states that she has an uneasy feeling often, not associated with position.  Began approximately 3 days ago, she has not been sleeping well.    She does not have any weakness    She denies any chest pain, fever, nausea, vomiting or diaphoresis.    She says sometimes she takes Tylenol and aspirin to help her sleep, that does make her feel better.    The patient denies any aggravating or alleviating factors - and has not taken any other medications in an attempt to alleviate her symptoms.    PMH, PSH, family history, social history, allergies reviewed with the patient with significant items noted above.    PCP: Ramsey, Rosezella [place], NP    Current Outpatient Medications   Medication Sig Dispense Refill   ??? OTHER Unknown cholesterol medication     ??? meclizine (ANTIVERT) 25 mg tablet Take 1 tablet by mouth every 6 hours for the next 3 days.  Then stop taking the meclizine.  Restart the meclizine for 3 day intervals if vertigo/ dizziness returns. 20 Tab 0   ??? lisinopril-hydrochlorothiazide (PRINZIDE, ZESTORETIC) 10-12.5 mg per tablet Take 1 Tab by mouth daily.    Indications: HYPERTENSION         Past History     Past Medical History:  Past Medical History:   Diagnosis Date   ??? Hypercholesterolemia    ??? Hypertension    ??? Iron deficiency anemias    ??? Ovarian tumor (benign)    ??? Retardation mental        Past Surgical History:  Past Surgical History:   Procedure Laterality Date   ??? HX HEMORRHOIDECTOMY     ??? HX HYSTERECTOMY  01/05/1992    fibroids   ??? HX OOPHORECTOMY   1990   ??? HX OVARIAN CYST REMOVAL  2011     ovarian tumor removed       Family History:  Family History   Problem Relation Age of Onset   ??? Hypertension Brother    ??? Cancer Brother    ??? Hypertension Mother    ??? Hypertension Father    ??? Hypertension Sister        Social History:  Social History     Tobacco Use   ??? Smoking status: Never Smoker   ??? Smokeless tobacco: Never Used   Substance Use Topics   ??? Alcohol use: No   ??? Drug use: No       Allergies:  No Known Allergies    Review of Systems   Review of Systems    In addition to that documented in the HPI above  All other review of systems negative    Constitutional: Denies fevers or chills  Eyes: Denies vision changes  ENMT: Denies sore throat  CV: Denies chest pain  Resp: Denies SOB  GI: Denies vomiting or diarrhea  GU: Denies painful urination  MSK: Denies recent  trauma  Skin: Denies new rashes  Neuro: Denies new numbness or tingling or weakness  Endocrine: Denies polyuria  Heme: Denies bleeding disorders    Physical Exam     Vitals:    07/07/20 1643   BP: (!) 126/45   Pulse: 63   Resp: 16   Temp: 98.6 ??F (37 ??C)   SpO2: 99%   Weight: 83.9 kg (185 lb)   Height: 5\' 6"  (1.676 m)     Physical Exam    Nursing notes and vital signs reviewed  General: Patient is awake and alert, resting comfortably in no acute distress  Head: Normocephalic, Atraumatic  Eyes: EOMI, no conjunctival pallor  Neck: Supple, Normal external exam  Cardiovascular: RRR, no murmur auscultated, warm, well-perfused extremities  Chest: Normal work of breathing and chest excursion bilaterally  Respiratory: Patient is in no respiratory distress, lungs CTAB  Abdomen: Soft, non tender, non distended  Back: No evidence of trauma or deformity  Extremities: No evidence of trauma or deformity, no LE edema  Skin: Warm and dry, intact  Neuro: Alert and appropriate, CN intact, normal speech, strength and sensation full and symmetric bilaterally, normal gait, normal coordination  Psychiatric: Normal mood and  affect    Medical Decision Making   I am the first provider for this patient.    I reviewed the vital signs, available nursing notes, past medical history, past surgical history, family history and social history.    Vital Signs-Reviewed the patient's vital signs.  Visit Vitals  BP (!) 126/45 (BP 1 Location: Left upper arm, BP Patient Position: At rest)   Pulse 63   Temp 98.6 ??F (37 ??C)   Resp 16   Ht 5\' 6"  (1.676 m)   Wt 83.9 kg (185 lb)   SpO2 99%   BMI 29.86 kg/m??       EKG interpretation: (Preliminary)  Interpreted by the EP. EKG non diagnostic, without acute ST elevation, arrhythmia, prolonged QT, or evidence of Brug[place]  No change from 2019    Provider Notes (Medical Decision Making):   Laurie Mercado is a 70 y.o. female with vague symptoms of headache, possible lightheadedness although she declined this when asked directly    Check labs, CT head, EKG.  Orthostatics were negative.      Procedures:  Procedures    ED Course:   ED Course as of 07/07/20 1923   Wed Jun 01, Mercado   1800 No electrolyte abnormality on chemistry panel.     [DM]   1816 CBC without leukocytosis or anemia.   [DM]   1828 Urinalysis w/o signs of infection     [DM]      ED Course User Index  [DM] 09/06/20, MD      6:35 PM  Patient's presentation, labs/imaging and plan of care was reviewed with Laurie Mercado as part of sign out.  Patient care transferred to Dr. Boston Mercado at this time.   Appreciate assistance in the care of this patient.     7:24 PM  I received pt in sign-out from Laurie Mercado. Pt with HA pending CT scan, which is negative.  Remaining work-up likewise unremarkable with no evidence of emergent medical condition clinically.  On my exam, pt well-appearing, ambulatory in ED, states she feels better and is ready to go home.  Pt has follow-up in a couple days.  Discussed results and poc for dc home, symptom management, follow-up, return precautions.        Diagnostic Study Results  Orders Placed This Encounter   ??? CT HEAD WO CONT      Standing Status:   Standing     Number of Occurrences:   1     Order Specific Question:   Transport     Answer:   Ambulatory [1]     Order Specific Question:   Reason for Exam     Answer:   headache     Order Specific Question:   Decision Support Exception     Answer:   Emergency Medical Condition (MA) [1]   ??? CBC WITH AUTOMATED DIFF     Standing Status:   Standing     Number of Occurrences:   1   ??? BASIC METABOLIC PANEL     Standing Status:   Standing     Number of Occurrences:   1   ??? URINALYSIS W/ RFLX MICROSCOPIC     Standing Status:   Standing     Number of Occurrences:   1   ??? EKG, 12 LEAD, INITIAL     Standing Status:   Standing     Number of Occurrences:   1     Order Specific Question:   Reason for Exam:     Answer:   lightheadedness   ??? OTHER     Sig: Unknown cholesterol medication   ??? acetaminophen (TYLENOL) tablet 1,000 mg       Labs -     Recent Results (from the past 12 hour(s))   CBC WITH AUTOMATED DIFF    Collection Time: 07/07/20  5:20 PM   Result Value Ref Range    WBC 5.9 4.6 - 13.2 K/uL    RBC 3.87 (L) 4.20 - 5.30 M/uL    HGB 10.3 (L) 12.0 - 16.0 g/dL    HCT 04.5 (L) 40.9 - 45.0 %    MCV 81.7 78.0 - 100.0 FL    MCH 26.6 24.0 - 34.0 PG    MCHC 32.6 31.0 - 37.0 g/dL    RDW 81.1 91.4 - 78.2 %    PLATELET 324 135 - 420 K/uL    MPV 9.8 9.2 - 11.8 FL    NRBC 0.0 0 PER 100 WBC    ABSOLUTE NRBC 0.00 0.00 - 0.01 K/uL    NEUTROPHILS 45 40 - 73 %    LYMPHOCYTES 44 21 - 52 %    MONOCYTES 9 3 - 10 %    EOSINOPHILS 1 0 - 5 %    BASOPHILS 0 0 - 2 %    IMMATURE GRANULOCYTES 0 0.0 - 0.5 %    ABS. NEUTROPHILS 2.7 1.8 - 8.0 K/UL    ABS. LYMPHOCYTES 2.6 0.9 - 3.6 K/UL    ABS. MONOCYTES 0.5 0.05 - 1.2 K/UL    ABS. EOSINOPHILS 0.1 0.0 - 0.4 K/UL    ABS. BASOPHILS 0.0 0.0 - 0.1 K/UL    ABS. IMM. GRANS. 0.0 0.00 - 0.04 K/UL    DF AUTOMATED     METABOLIC PANEL, BASIC    Collection Time: 07/07/20  5:20 PM   Result Value Ref Range    Sodium 139 136 - 145 mmol/L    Potassium 3.6 3.5 - 5.5 mmol/L    Chloride 102 100 - 111  mmol/L    CO2 31 21 - 32 mmol/L    Anion gap 6 3.0 - 18 mmol/L    Glucose 94 74 - 99 mg/dL    BUN 14 7.0 - 18 MG/DL  Creatinine 0.86 0.6 - 1.3 MG/DL    BUN/Creatinine ratio 16 12 - 20      GFR est AA >60 >60 ml/min/1.4173m2    GFR est non-AA >60 >60 ml/min/1.2273m2    Calcium 9.2 8.5 - 10.1 MG/DL   URINALYSIS W/ RFLX MICROSCOPIC    Collection Time: 07/07/20  5:50 PM   Result Value Ref Range    Color YELLOW      Appearance CLEAR      Specific gravity 1.023 1.005 - 1.030      pH (UA) 6.5 5.0 - 8.0      Protein Negative NEG mg/dL    Glucose Negative NEG mg/dL    Ketone Negative NEG mg/dL    Bilirubin Negative NEG      Blood Negative NEG      Urobilinogen 0.2 0.2 - 1.0 EU/dL    Nitrites Negative NEG      Leukocyte Esterase Negative NEG     EKG, 12 LEAD, INITIAL    Collection Time: 07/07/20  6:15 PM   Result Value Ref Range    Ventricular Rate 62 BPM    Atrial Rate 62 BPM    P-R Interval 186 ms    QRS Duration 82 ms    Q-T Interval 432 ms    QTC Calculation (Bezet) 438 ms    Calculated P Axis 65 degrees    Calculated R Axis 21 degrees    Calculated T Axis 29 degrees    Diagnosis       Normal sinus rhythm  Septal infarct (cited on or before 18-Feb-2017)  Abnormal ECG  When compared with ECG of 18-Feb-2017 10:06,  premature supraventricular complexes are no longer present         Radiologic Studies -   CT HEAD WO CONT   Final Result      No acute intracranial findings. Of note, MRI is more sensitive for detecting   acute infarct.      A few ICA siphon atherosclerotic calcifications.        CT Results  (Last 48 hours)               07/07/20 1712  CT HEAD WO CONT Final result    Impression:      No acute intracranial findings. Of note, MRI is more sensitive for detecting   acute infarct.       A few ICA siphon atherosclerotic calcifications.       Narrative:  EXAMINATION: CT head without contrast       INDICATION: Vertigo, headache       COMPARISON: CT 02/18/2017       TECHNIQUE: CT head without contrast with multiplanar  reformations. All CT scans   at this facility are performed using dose optimization technique as appropriate   to a performed exam, to include automated exposure control, adjustment of the mA   and/or kV according to patient size (including appropriate matching first site   specific examinations), or use of iterative reconstruction technique.       FINDINGS:       No focal mass effect or shift of midline structures. No abnormal extra-axial   collection. Ventricles are normal in size and configuration. No acute   intracranial hemorrhage. No suspicious parenchymal lesions. A few ICA siphon   atherosclerotic calcifications.       Imaged paranasal sinuses and mastoids well-aerated. Calvarium intact.   Superficial soft tissues unremarkable.  CXR Results  (Last 48 hours)    None          Disposition     Disposition:  Discharged    CLINICAL IMPRESSION:    1. Tension-type headache, not intractable, unspecified chronicity pattern    2. Dizziness and giddiness        Follow-up Information     Follow up With Specialties Details Why Contact Info    [facility] Emergency Medicine Go to  If symptoms worsen [address] [place] [address]  [phone]    Mervin Kung, NP Nurse Practitioner   [address]  [place]  [place] [address]  [phone]            Current Discharge Medication List          Please note that this dictation was completed with Dragon, the computer voice recognition software.  Quite often unanticipated grammatical, syntax, homophones, and other interpretive errors are inadvertently transcribed by the computer software.  Please disregard these errors.  Please excuse any errors that have escaped final proofreading. Please don't hesitate to contact me with questions.

## 2020-07-08 LAB — EKG 12-LEAD
Atrial Rate: 62 {beats}/min
Diagnosis: NORMAL
P Axis: 65 degrees
P-R Interval: 186 ms
Q-T Interval: 432 ms
QRS Duration: 82 ms
QTc Calculation (Bazett): 438 ms
R Axis: 21 degrees
T Axis: 29 degrees
Ventricular Rate: 62 {beats}/min

## 2020-07-08 LAB — EKG, 12 LEAD, INITIAL
Atrial Rate: 62 {beats}/min
Calculated P Axis: 65 degrees
Calculated R Axis: 21 degrees
Calculated T Axis: 29 degrees
Diagnosis: NORMAL
P-R Interval: 186 ms
Q-T Interval: 432 ms
QRS Duration: 82 ms
QTC Calculation (Bezet): 438 ms
Ventricular Rate: 62 {beats}/min

## 2020-10-12 ENCOUNTER — Emergency Department: Admit: 2020-10-13 | Payer: MEDICARE | Primary: Nurse Practitioner

## 2020-10-12 ENCOUNTER — Inpatient Hospital Stay
Admit: 2020-10-12 | Discharge: 2020-10-13 | Disposition: A | Discharge status: Home / Self Care | Payer: MEDICARE | Attending: Emergency Medicine

## 2020-10-12 DIAGNOSIS — S161XXA Strain of muscle, fascia and tendon at neck level, initial encounter: Secondary | ICD-10-CM

## 2020-10-12 NOTE — Progress Notes (Signed)
Discharge instructions given to patient. Follow up information provided. Verbalized understanding.

## 2020-10-12 NOTE — ED Notes (Signed)
Patient made aware of potential extended wait for room placement related to ER high volume.  Patient advised to report any new or worsening symptoms immediately.  Patient voices understanding.
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
## 2021-03-11 ENCOUNTER — Encounter

## 2021-03-19 ENCOUNTER — Encounter

## 2021-03-23 ENCOUNTER — Encounter

## 2021-03-24 ENCOUNTER — Encounter

## 2021-03-24 ENCOUNTER — Inpatient Hospital Stay: Admit: 2021-03-24 | Payer: PRIVATE HEALTH INSURANCE | Primary: Nurse Practitioner

## 2021-03-24 DIAGNOSIS — E7800 Pure hypercholesterolemia, unspecified: Secondary | ICD-10-CM

## 2021-03-24 LAB — LIPID PANEL
Chol/HDL Ratio: 2.9 (ref 0–5.0)
Cholesterol, Total: 184 MG/DL (ref <200)
HDL: 63 MG/DL — ABNORMAL HIGH (ref 40–60)
LDL Calculated: 101.8 MG/DL — ABNORMAL HIGH (ref 0–100)
Triglycerides: 96 MG/DL (ref <150)
VLDL Cholesterol Calculated: 19.2 MG/DL

## 2021-03-24 LAB — CBC WITH AUTO DIFFERENTIAL
Absolute Eos #: 0.1 10*3/uL (ref 0.0–0.4)
Absolute Immature Granulocyte: 0 10*3/uL (ref 0.00–0.04)
Absolute Lymph #: 2.5 10*3/uL (ref 0.9–3.6)
Absolute Mono #: 0.5 10*3/uL (ref 0.05–1.2)
Basophils Absolute: 0 10*3/uL (ref 0.0–0.1)
Basophils: 0 % (ref 0–2)
Eosinophils %: 2 % (ref 0–5)
Hematocrit: 31.2 % — ABNORMAL LOW (ref 35.0–45.0)
Hemoglobin: 9.8 g/dL — ABNORMAL LOW (ref 12.0–16.0)
Immature Granulocytes: 0 % (ref 0.0–0.5)
Lymphocytes: 43 % (ref 21–52)
MCH: 26.6 PG (ref 24.0–34.0)
MCHC: 31.4 g/dL (ref 31.0–37.0)
MCV: 84.6 FL (ref 78.0–100.0)
MPV: 10.1 FL (ref 9.2–11.8)
Monocytes: 8 % (ref 3–10)
Nucleated RBCs: 0 PER 100 WBC
Platelets: 303 10*3/uL (ref 135–420)
RBC: 3.69 M/uL — ABNORMAL LOW (ref 4.20–5.30)
RDW: 15.5 % — ABNORMAL HIGH (ref 11.6–14.5)
Seg Neutrophils: 47 % (ref 40–73)
Segs Absolute: 2.7 10*3/uL (ref 1.8–8.0)
WBC: 5.8 10*3/uL (ref 4.6–13.2)
nRBC: 0 10*3/uL (ref 0.00–0.01)

## 2021-03-24 LAB — COMPREHENSIVE METABOLIC PANEL
ALT: 18 U/L (ref 13–56)
AST: 22 U/L (ref 10–38)
Albumin/Globulin Ratio: 1 (ref 0.8–1.7)
Albumin: 3.5 g/dL (ref 3.4–5.0)
Alk Phosphatase: 91 U/L (ref 45–117)
Anion Gap: 3 mmol/L (ref 3.0–18)
BUN: 13 MG/DL (ref 7.0–18)
Bun/Cre Ratio: 16 (ref 12–20)
CO2: 31 mmol/L (ref 21–32)
Calcium: 9.2 MG/DL (ref 8.5–10.1)
Chloride: 105 mmol/L (ref 100–111)
Creatinine: 0.8 MG/DL (ref 0.6–1.3)
Est, Glom Filt Rate: >60 mL/min/{1.73_m2} (ref >60)
Globulin: 3.4 g/dL (ref 2.0–4.0)
Glucose: 84 mg/dL (ref 74–99)
Potassium: 4.2 mmol/L (ref 3.5–5.5)
Sodium: 139 mmol/L (ref 136–145)
Total Bilirubin: 0.4 MG/DL (ref 0.2–1.0)
Total Protein: 6.9 g/dL (ref 6.4–8.2)

## 2021-03-24 LAB — T4, FREE: T4 Free: 1 NG/DL (ref 0.7–1.5)

## 2021-03-24 LAB — TSH: TSH, 3RD GENERATION: 2.99 u[IU]/mL (ref 0.36–3.74)

## 2021-03-24 LAB — VITAMIN D 25 HYDROXY: Vit D, 25-Hydroxy: 35.3 ng/mL (ref 30–100)

## 2021-03-31 ENCOUNTER — Encounter

## 2021-04-20 ENCOUNTER — Ambulatory Visit: Payer: PRIVATE HEALTH INSURANCE | Primary: Nurse Practitioner

## 2021-12-04 ENCOUNTER — Encounter

## 2021-12-13 ENCOUNTER — Inpatient Hospital Stay: Admit: 2021-12-13 | Payer: MEDICARE | Attending: Family Medicine | Primary: Nurse Practitioner

## 2021-12-13 DIAGNOSIS — M8588 Other specified disorders of bone density and structure, other site: Secondary | ICD-10-CM

## 2022-01-01 ENCOUNTER — Encounter

## 2022-02-08 ENCOUNTER — Encounter

## 2022-02-08 NOTE — Progress Notes (Signed)
Mammo

## 2022-02-21 ENCOUNTER — Inpatient Hospital Stay: Admit: 2022-02-21 | Payer: MEDICARE | Primary: Nurse Practitioner

## 2022-02-21 ENCOUNTER — Inpatient Hospital Stay: Admit: 2022-02-21 | Payer: PRIVATE HEALTH INSURANCE | Primary: Nurse Practitioner

## 2022-02-21 ENCOUNTER — Encounter

## 2022-02-21 DIAGNOSIS — Z1231 Encounter for screening mammogram for malignant neoplasm of breast: Secondary | ICD-10-CM

## 2022-02-21 DIAGNOSIS — I10 Essential (primary) hypertension: Secondary | ICD-10-CM

## 2022-02-21 LAB — CBC WITH AUTO DIFFERENTIAL
Absolute Immature Granulocyte: 0 10*3/uL (ref 0.00–0.04)
Basophils %: 0 % (ref 0–2)
Basophils Absolute: 0 10*3/uL (ref 0.0–0.1)
Eosinophils %: 1 % (ref 0–5)
Eosinophils Absolute: 0.1 10*3/uL (ref 0.0–0.4)
Hematocrit: 32.3 % — ABNORMAL LOW (ref 35.0–45.0)
Hemoglobin: 10.5 g/dL — ABNORMAL LOW (ref 12.0–16.0)
Immature Granulocytes: 0 % (ref 0.0–0.5)
Lymphocytes %: 47 % (ref 21–52)
Lymphocytes Absolute: 2.6 10*3/uL (ref 0.9–3.6)
MCH: 27.4 PG (ref 24.0–34.0)
MCHC: 32.5 g/dL (ref 31.0–37.0)
MCV: 84.3 FL (ref 78.0–100.0)
MPV: 10 FL (ref 9.2–11.8)
Monocytes %: 9 % (ref 3–10)
Monocytes Absolute: 0.5 10*3/uL (ref 0.05–1.2)
Neutrophils %: 43 % (ref 40–73)
Neutrophils Absolute: 2.3 10*3/uL (ref 1.8–8.0)
Nucleated RBCs: 0 PER 100 WBC
Platelets: 245 10*3/uL (ref 135–420)
RBC: 3.83 M/uL — ABNORMAL LOW (ref 4.20–5.30)
RDW: 14.1 % (ref 11.6–14.5)
WBC: 5.4 10*3/uL (ref 4.6–13.2)
nRBC: 0 10*3/uL (ref 0.00–0.01)

## 2022-02-21 LAB — COMPREHENSIVE METABOLIC PANEL
ALT: 18 U/L (ref 13–56)
AST: 16 U/L (ref 10–38)
Albumin/Globulin Ratio: 1 (ref 0.8–1.7)
Albumin: 3.5 g/dL (ref 3.4–5.0)
Alk Phosphatase: 88 U/L (ref 45–117)
Anion Gap: 3 mmol/L (ref 3.0–18)
BUN: 18 MG/DL (ref 7.0–18)
Bun/Cre Ratio: 20 (ref 12–20)
CO2: 29 mmol/L (ref 21–32)
Calcium: 9.1 MG/DL (ref 8.5–10.1)
Chloride: 108 mmol/L (ref 100–111)
Creatinine: 0.9 MG/DL (ref 0.6–1.3)
Est, Glom Filt Rate: >60 mL/min/{1.73_m2} (ref >60)
Globulin: 3.4 g/dL (ref 2.0–4.0)
Glucose: 99 mg/dL (ref 74–99)
Potassium: 4.1 mmol/L (ref 3.5–5.5)
Sodium: 140 mmol/L (ref 136–145)
Total Bilirubin: 0.4 MG/DL (ref 0.2–1.0)
Total Protein: 6.9 g/dL (ref 6.4–8.2)

## 2022-02-21 LAB — LIPID PANEL
Chol/HDL Ratio: 2.5 (ref 0–5.0)
Cholesterol, Total: 169 MG/DL (ref <200)
HDL: 68 MG/DL — ABNORMAL HIGH (ref 40–60)
LDL Calculated: 91.8 MG/DL (ref 0–100)
Triglycerides: 46 MG/DL (ref <150)
VLDL Cholesterol Calculated: 9.2 MG/DL

## 2022-02-21 LAB — HEMOGLOBIN A1C
Estimated Avg Glucose: 114 mg/dL
Hemoglobin A1C: 5.6 % (ref 4.2–5.6)

## 2022-02-21 LAB — TSH + FREE T4 PANEL
T4 Free: 1 NG/DL (ref 0.7–1.5)
TSH, 3RD GENERATION: 2.14 u[IU]/mL (ref 0.36–3.74)

## 2022-02-21 LAB — VITAMIN D 25 HYDROXY: Vit D, 25-Hydroxy: 37.2 ng/mL (ref 30–100)

## 2023-04-14 LAB — HEMOGLOBIN A1C
Estimated Avg Glucose, External: 119 mg/dL (ref 91–123)
Hemoglobin A1C, External: 5.8 % — ABNORMAL HIGH (ref 4.8–5.6)

## 2023-09-19 ENCOUNTER — Encounter

## 2023-10-27 ENCOUNTER — Inpatient Hospital Stay: Admit: 2023-10-27 | Payer: Medicare (Managed Care) | Primary: Nurse Practitioner
